# Patient Record
Sex: Female | Born: 1935 | Race: White | Hispanic: No
Health system: Southern US, Community
[De-identification: ages and names within clinical notes are randomized; demographics above are authoritative.]

---

## 1999-09-14 ENCOUNTER — Encounter: Payer: Self-pay | Admitting: Surgery

## 1999-09-15 ENCOUNTER — Inpatient Hospital Stay (HOSPITAL_COMMUNITY): Admission: RE | Admit: 1999-09-15 | Discharge: 1999-09-25 | Payer: Self-pay | Admitting: Surgery

## 1999-09-15 ENCOUNTER — Encounter: Payer: Self-pay | Admitting: Surgery

## 1999-09-16 ENCOUNTER — Encounter: Payer: Self-pay | Admitting: Critical Care Medicine

## 1999-09-16 ENCOUNTER — Encounter: Payer: Self-pay | Admitting: Surgery

## 1999-09-18 ENCOUNTER — Encounter: Payer: Self-pay | Admitting: *Deleted

## 1999-10-01 ENCOUNTER — Encounter: Payer: Self-pay | Admitting: Surgery

## 1999-10-01 ENCOUNTER — Inpatient Hospital Stay (HOSPITAL_COMMUNITY): Admission: AD | Admit: 1999-10-01 | Discharge: 1999-10-06 | Payer: Self-pay | Admitting: Surgery

## 2000-09-12 ENCOUNTER — Encounter: Admission: RE | Admit: 2000-09-12 | Discharge: 2000-09-12 | Payer: Self-pay | Admitting: Pulmonary Disease

## 2000-09-12 ENCOUNTER — Encounter: Payer: Self-pay | Admitting: Pulmonary Disease

## 2000-09-13 ENCOUNTER — Encounter (INDEPENDENT_AMBULATORY_CARE_PROVIDER_SITE_OTHER): Payer: Self-pay

## 2000-09-13 ENCOUNTER — Ambulatory Visit: Admission: RE | Admit: 2000-09-13 | Discharge: 2000-09-13 | Payer: Self-pay | Admitting: Pulmonary Disease

## 2000-10-17 ENCOUNTER — Encounter (INDEPENDENT_AMBULATORY_CARE_PROVIDER_SITE_OTHER): Payer: Self-pay

## 2000-10-17 ENCOUNTER — Other Ambulatory Visit: Admission: RE | Admit: 2000-10-17 | Discharge: 2000-10-17 | Payer: Self-pay | Admitting: Gastroenterology

## 2000-11-07 ENCOUNTER — Ambulatory Visit (HOSPITAL_COMMUNITY): Admission: RE | Admit: 2000-11-07 | Discharge: 2000-11-07 | Payer: Self-pay | Admitting: Gastroenterology

## 2000-12-15 ENCOUNTER — Ambulatory Visit (HOSPITAL_COMMUNITY): Admission: RE | Admit: 2000-12-15 | Discharge: 2000-12-15 | Payer: Self-pay | Admitting: Surgery

## 2000-12-15 ENCOUNTER — Encounter: Payer: Self-pay | Admitting: Surgery

## 2001-01-12 ENCOUNTER — Encounter: Payer: Self-pay | Admitting: Surgery

## 2001-01-18 ENCOUNTER — Inpatient Hospital Stay (HOSPITAL_COMMUNITY): Admission: RE | Admit: 2001-01-18 | Discharge: 2001-01-23 | Payer: Self-pay | Admitting: Surgery

## 2001-01-19 ENCOUNTER — Encounter: Payer: Self-pay | Admitting: Surgery

## 2001-04-14 ENCOUNTER — Ambulatory Visit (HOSPITAL_COMMUNITY): Admission: RE | Admit: 2001-04-14 | Discharge: 2001-04-14 | Payer: Self-pay | Admitting: Internal Medicine

## 2001-04-14 ENCOUNTER — Encounter: Payer: Self-pay | Admitting: Internal Medicine

## 2001-04-27 ENCOUNTER — Inpatient Hospital Stay (HOSPITAL_COMMUNITY): Admission: AD | Admit: 2001-04-27 | Discharge: 2001-05-01 | Payer: Self-pay | Admitting: Internal Medicine

## 2001-04-27 ENCOUNTER — Encounter: Payer: Self-pay | Admitting: Internal Medicine

## 2001-07-04 ENCOUNTER — Encounter: Payer: Self-pay | Admitting: Pulmonary Disease

## 2001-07-04 ENCOUNTER — Ambulatory Visit: Admission: RE | Admit: 2001-07-04 | Discharge: 2001-07-04 | Payer: Self-pay | Admitting: Pulmonary Disease

## 2001-12-08 ENCOUNTER — Ambulatory Visit (HOSPITAL_COMMUNITY): Admission: RE | Admit: 2001-12-08 | Discharge: 2001-12-08 | Payer: Self-pay | Admitting: Internal Medicine

## 2001-12-08 ENCOUNTER — Encounter: Payer: Self-pay | Admitting: Internal Medicine

## 2002-04-17 ENCOUNTER — Ambulatory Visit (HOSPITAL_COMMUNITY): Admission: RE | Admit: 2002-04-17 | Discharge: 2002-04-17 | Payer: Self-pay | Admitting: Internal Medicine

## 2002-04-17 ENCOUNTER — Encounter: Payer: Self-pay | Admitting: Internal Medicine

## 2002-08-03 ENCOUNTER — Encounter: Payer: Self-pay | Admitting: Emergency Medicine

## 2002-08-04 ENCOUNTER — Inpatient Hospital Stay (HOSPITAL_COMMUNITY): Admission: EM | Admit: 2002-08-04 | Discharge: 2002-08-07 | Payer: Self-pay | Admitting: Emergency Medicine

## 2002-08-04 ENCOUNTER — Encounter: Payer: Self-pay | Admitting: Emergency Medicine

## 2002-08-04 ENCOUNTER — Encounter: Payer: Self-pay | Admitting: General Surgery

## 2002-08-05 ENCOUNTER — Encounter: Payer: Self-pay | Admitting: General Surgery

## 2003-01-26 ENCOUNTER — Emergency Department (HOSPITAL_COMMUNITY): Admission: EM | Admit: 2003-01-26 | Discharge: 2003-01-26 | Payer: Self-pay | Admitting: *Deleted

## 2003-01-26 ENCOUNTER — Encounter: Payer: Self-pay | Admitting: *Deleted

## 2003-11-13 ENCOUNTER — Inpatient Hospital Stay (HOSPITAL_COMMUNITY): Admission: RE | Admit: 2003-11-13 | Discharge: 2003-11-19 | Payer: Self-pay | Admitting: Psychiatry

## 2003-11-13 ENCOUNTER — Emergency Department (HOSPITAL_COMMUNITY): Admission: EM | Admit: 2003-11-13 | Discharge: 2003-11-14 | Payer: Self-pay | Admitting: Emergency Medicine

## 2003-12-06 ENCOUNTER — Emergency Department (HOSPITAL_COMMUNITY): Admission: EM | Admit: 2003-12-06 | Discharge: 2003-12-06 | Payer: Self-pay | Admitting: Emergency Medicine

## 2003-12-11 ENCOUNTER — Inpatient Hospital Stay (HOSPITAL_COMMUNITY): Admission: RE | Admit: 2003-12-11 | Discharge: 2003-12-24 | Payer: Self-pay | Admitting: Psychiatry

## 2003-12-13 ENCOUNTER — Encounter: Payer: Self-pay | Admitting: Emergency Medicine

## 2003-12-22 ENCOUNTER — Encounter: Payer: Self-pay | Admitting: Psychiatry

## 2004-04-02 ENCOUNTER — Ambulatory Visit (HOSPITAL_COMMUNITY): Admission: RE | Admit: 2004-04-02 | Discharge: 2004-04-02 | Payer: Self-pay | Admitting: Neurology

## 2004-04-23 ENCOUNTER — Ambulatory Visit: Payer: Self-pay | Admitting: Psychiatry

## 2004-06-05 ENCOUNTER — Ambulatory Visit (HOSPITAL_COMMUNITY): Admission: RE | Admit: 2004-06-05 | Discharge: 2004-06-05 | Payer: Self-pay | Admitting: Internal Medicine

## 2004-07-02 ENCOUNTER — Ambulatory Visit: Payer: Self-pay | Admitting: Psychiatry

## 2004-08-05 ENCOUNTER — Emergency Department (HOSPITAL_COMMUNITY): Admission: EM | Admit: 2004-08-05 | Discharge: 2004-08-05 | Payer: Self-pay | Admitting: Emergency Medicine

## 2004-08-13 ENCOUNTER — Ambulatory Visit: Payer: Self-pay | Admitting: Psychiatry

## 2004-09-24 ENCOUNTER — Ambulatory Visit: Payer: Self-pay | Admitting: Psychiatry

## 2004-11-18 ENCOUNTER — Ambulatory Visit (HOSPITAL_COMMUNITY): Admission: RE | Admit: 2004-11-18 | Discharge: 2004-11-18 | Payer: Self-pay | Admitting: Internal Medicine

## 2004-11-18 ENCOUNTER — Ambulatory Visit: Payer: Self-pay | Admitting: Internal Medicine

## 2005-01-07 IMAGING — CR DG CHEST 2V
3 series · 3 of 3 positions shown · non-contrast
Comparison: none

CLINICAL DATA: Cough and hemoptysis.
 TWO VIEW CHEST
 Comparison, 12/13/2003.
 Mild atelectasis or scarring is again noted at the right middle lobe, which is unchanged.  There is no evidence of pulmonary consolidation or edema.  There is no evidence of pleural effusion.  Heart size is within normal limits.  Mild tortuosity of the thoracic aorta is noted.
 IMPRESSION
 Mild right middle lobe atelectasis vs. scarring, without change.  Otherwise, clear lungs.

[view not recorded (1 of 3)]
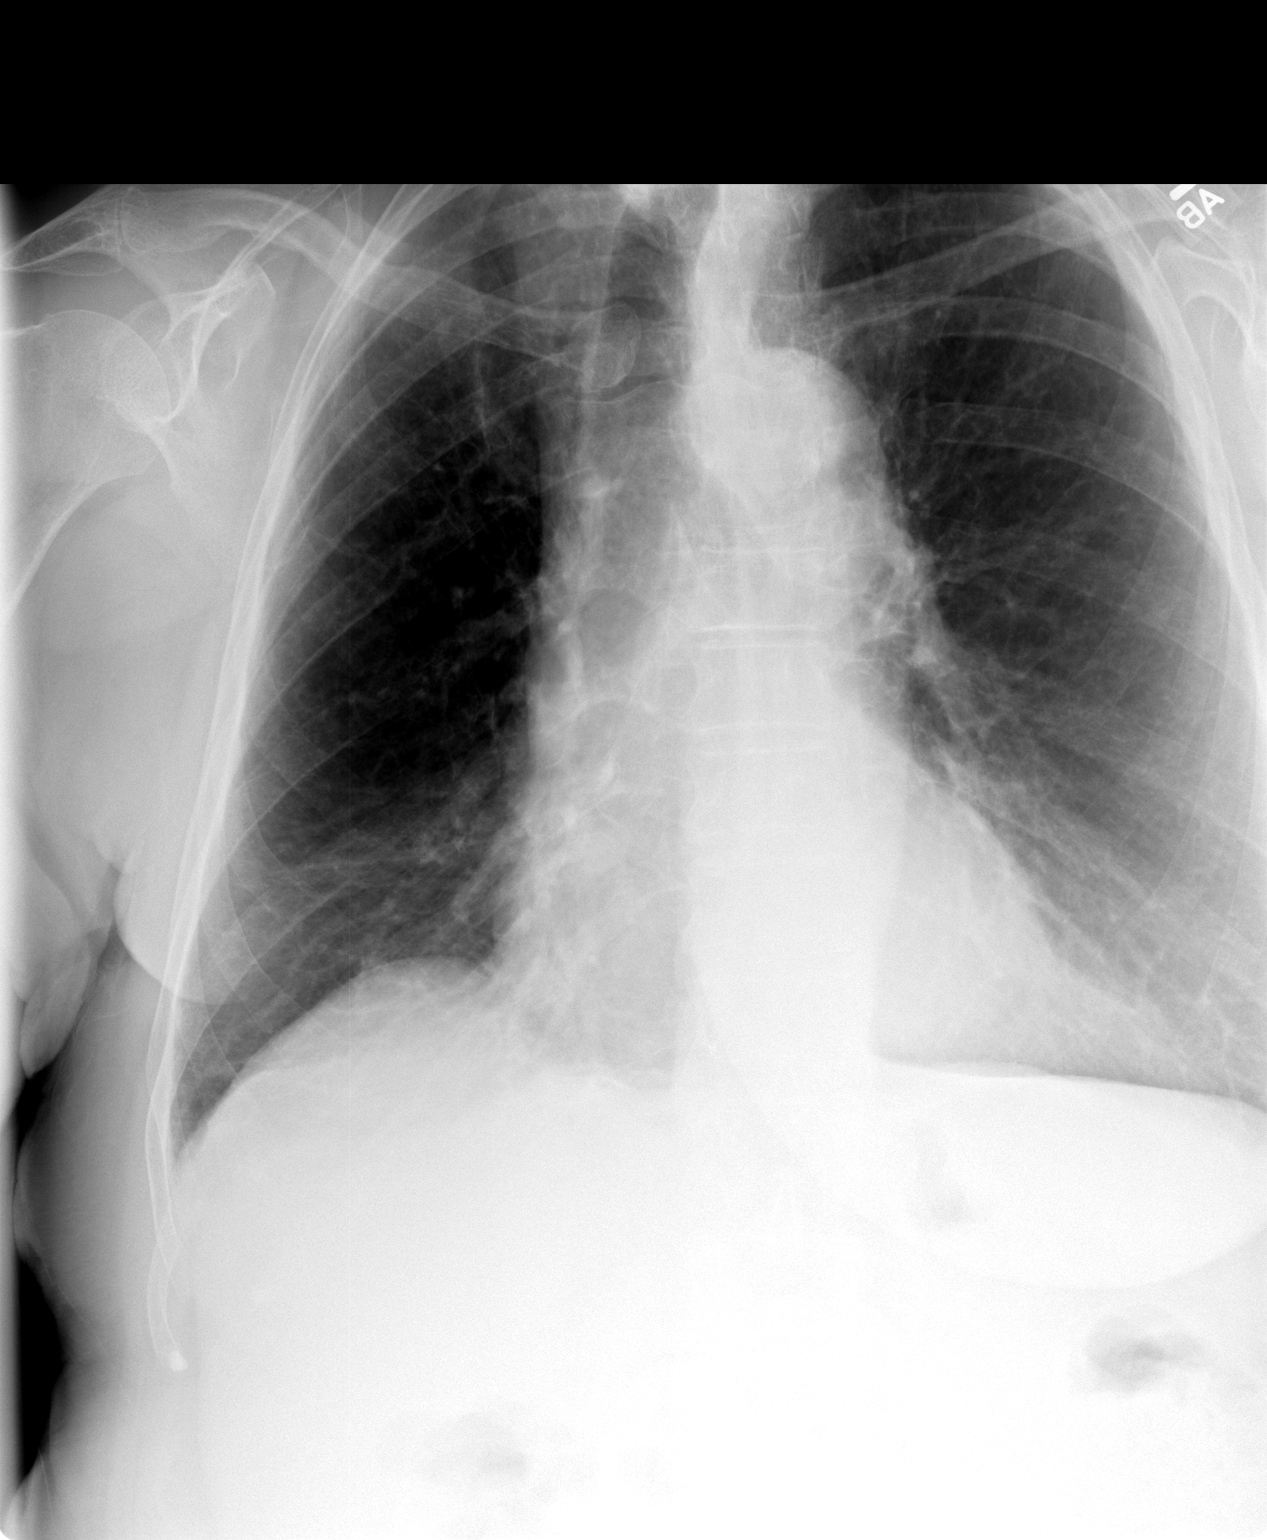

[view not recorded (2 of 3)]
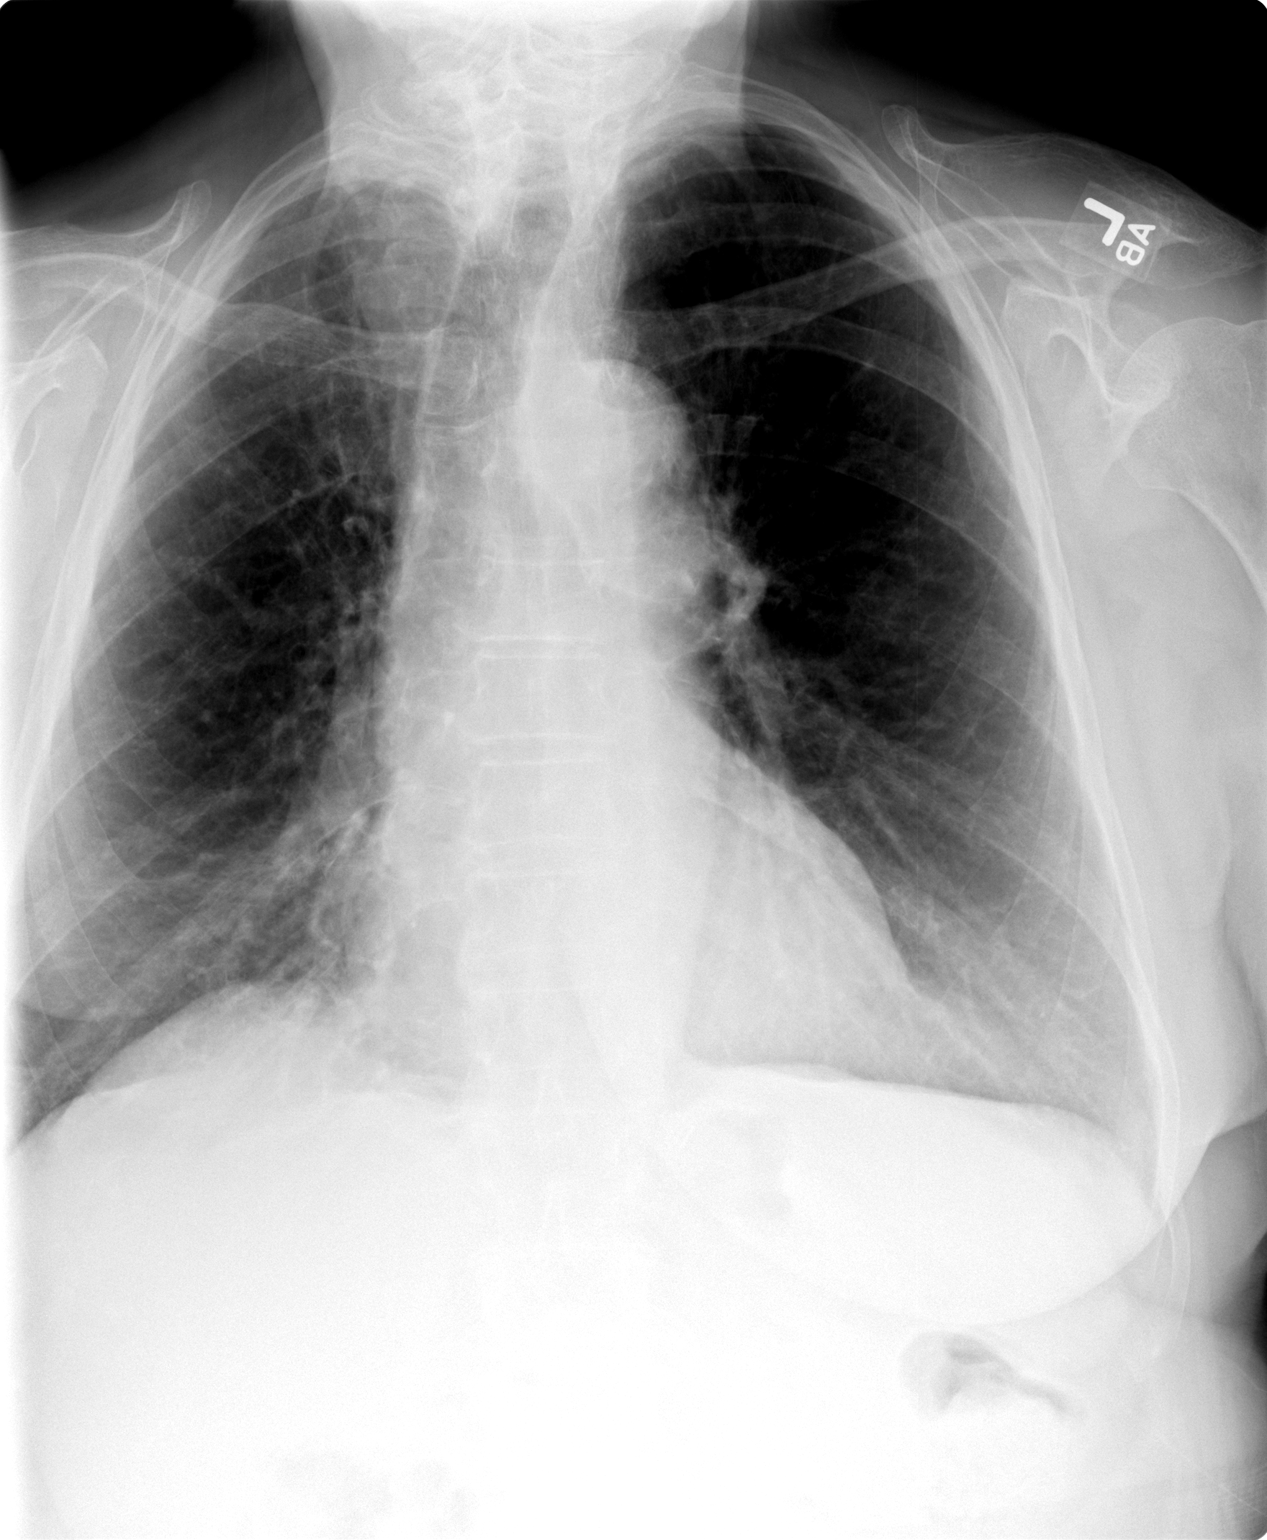

[view not recorded (3 of 3)]
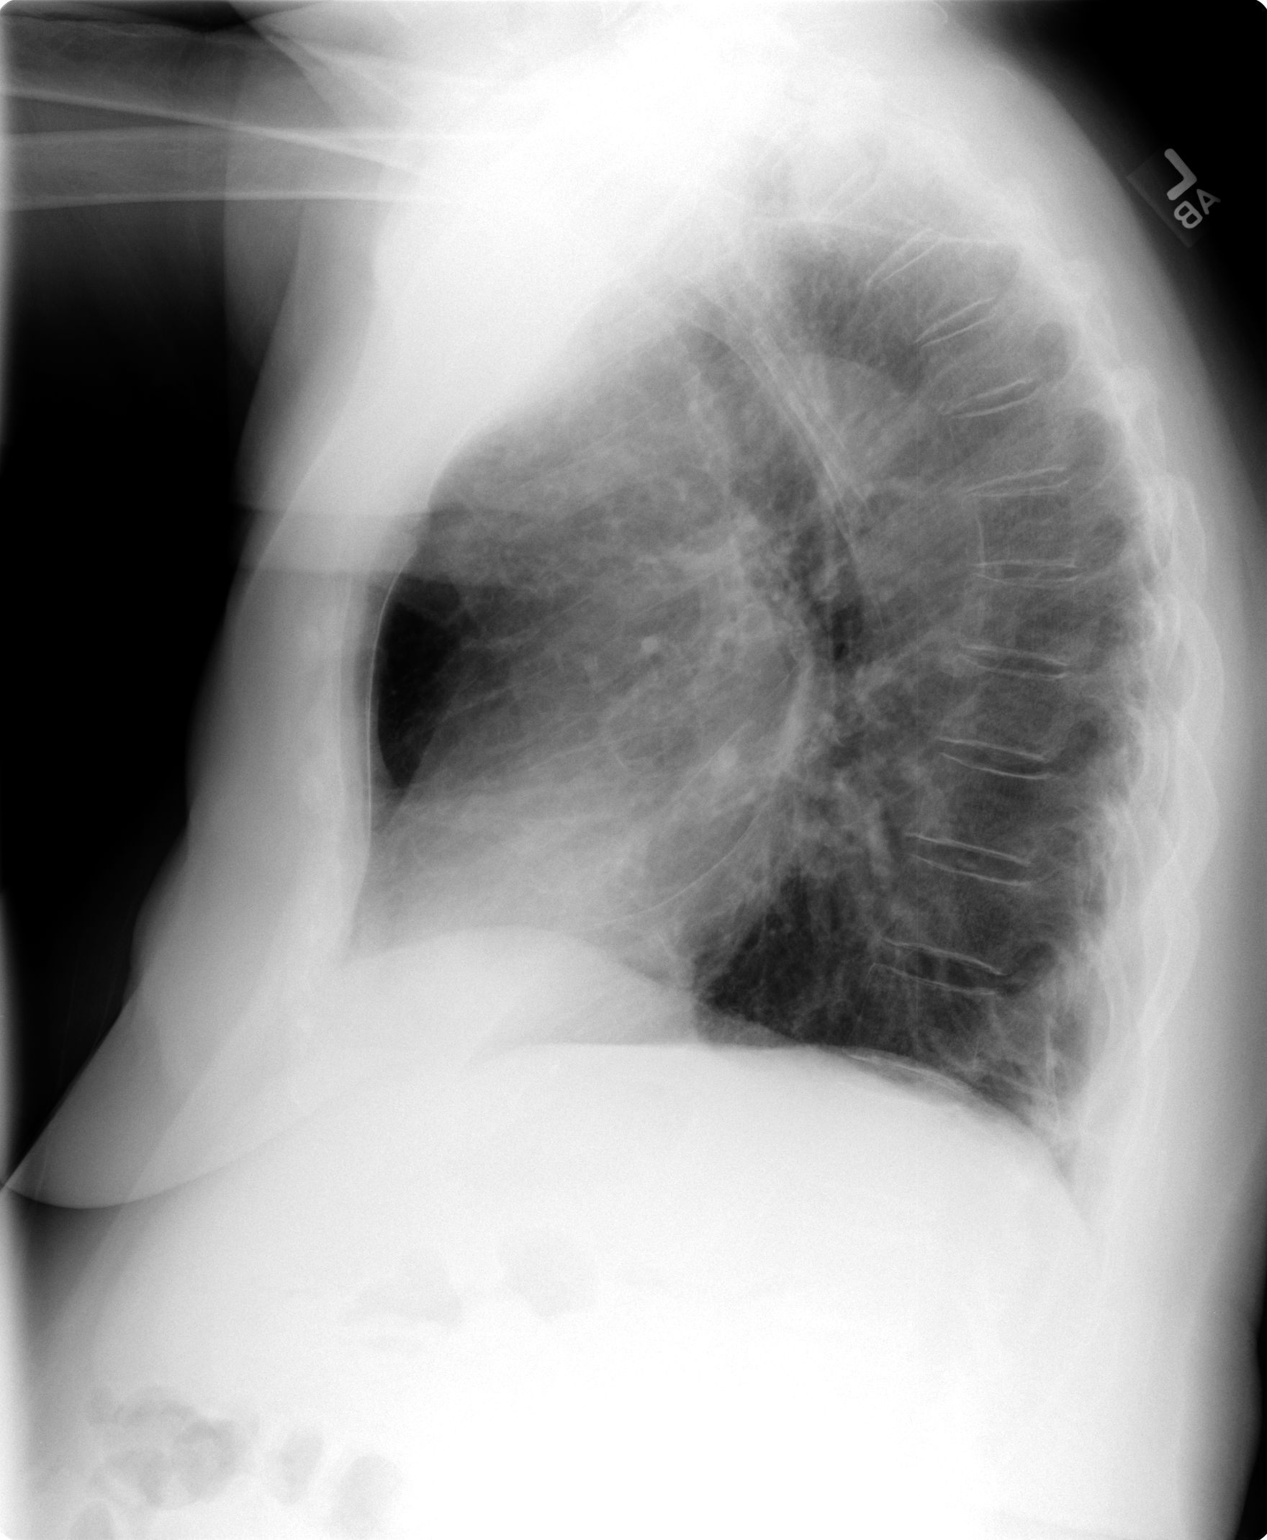

[3 of 3 positions shown; findings below may reference images not displayed]

## 2005-02-11 ENCOUNTER — Ambulatory Visit: Payer: Self-pay | Admitting: Psychiatry

## 2005-04-19 IMAGING — CT CT HEAD W/O CM
1 series · 16 of 28 positions shown, 20 images · non-contrast
Comparison: none

CLINICAL DATA: dizziness, forgetfulness.
 CT HEAD W/O CONTRAST
 Routine noncontrast CT head without priors for comparison.
 Mild generalized atrophy. Benign basal ganglia calcifications bilaterally.  Low attenuation at scattered site of deep cerebral white matter probably representing small vessel chronic ischemic changes.  No evidence of mass, hemorrhage or infarct.  Scattered atherosclerotic calcification, internal carotid and vertebral arteries. Sinuses clear.  Skull intact.
 IMPRESSION
 Atrophy with mild small vessel chronic ischemic changes of deep cerebral white matter. No acute intracranial abnormality.

[Series 2617: — · axial · 0.45mm/px · z∈[-704,-579]mm · 16 of 28 slices shown, 20 images]
[im 2/28  brain]
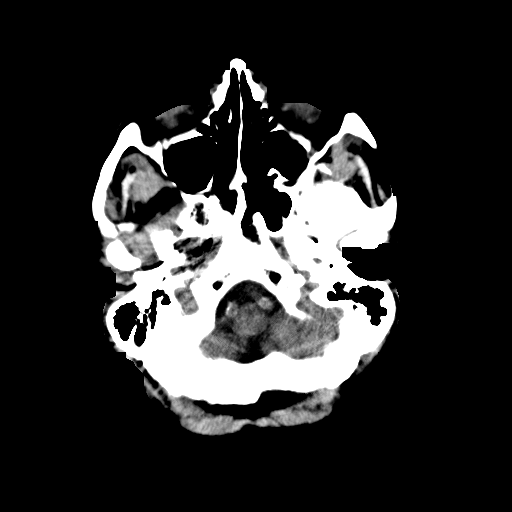
[im 2/28  bone]
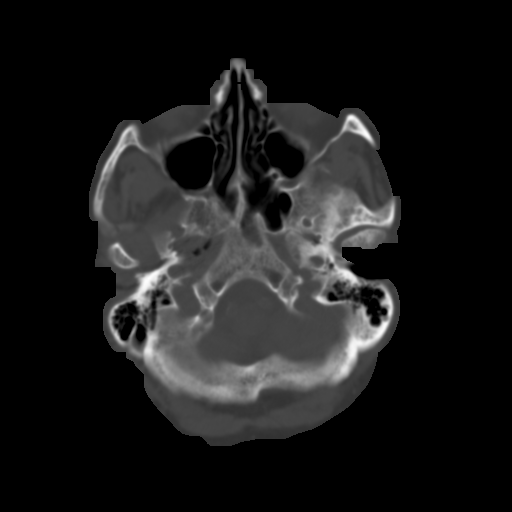
[im 4/28  brain]
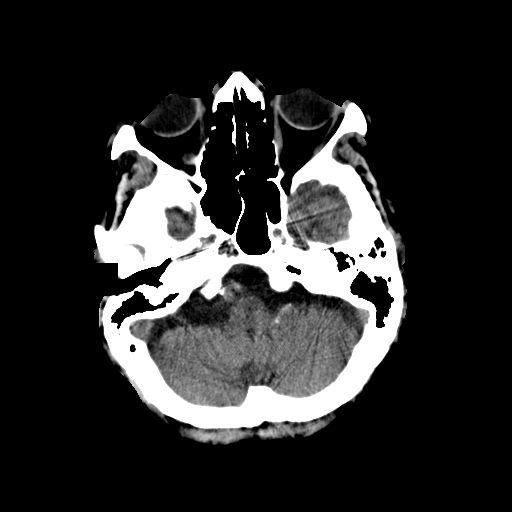
[im 6/28  brain]
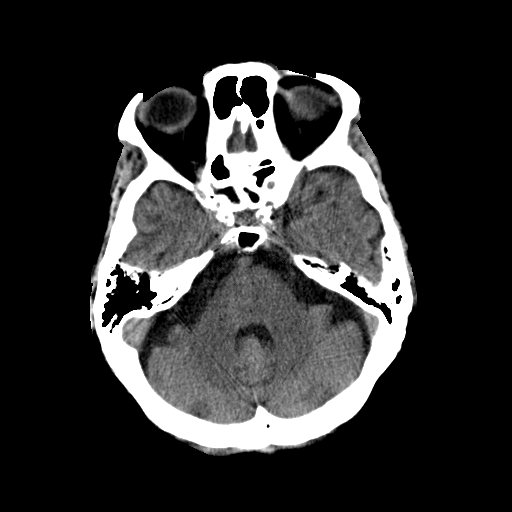
[im 7/28  brain]
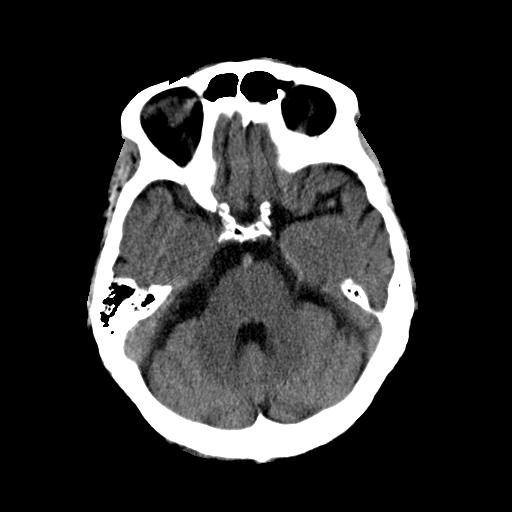
[im 9/28  brain]
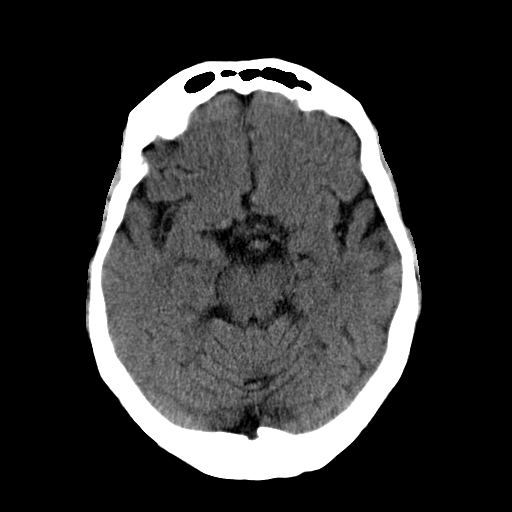
[im 9/28  bone]
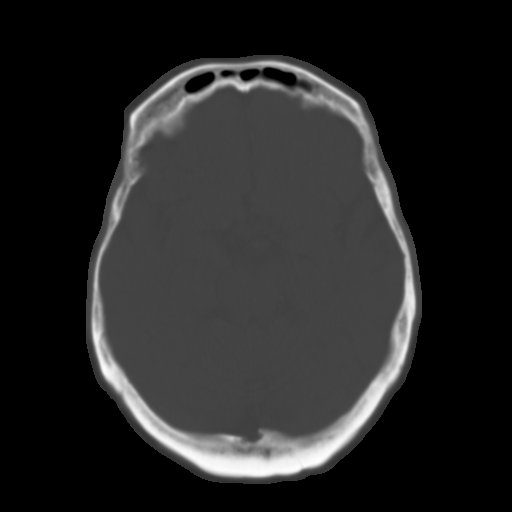
[im 10/28  brain]
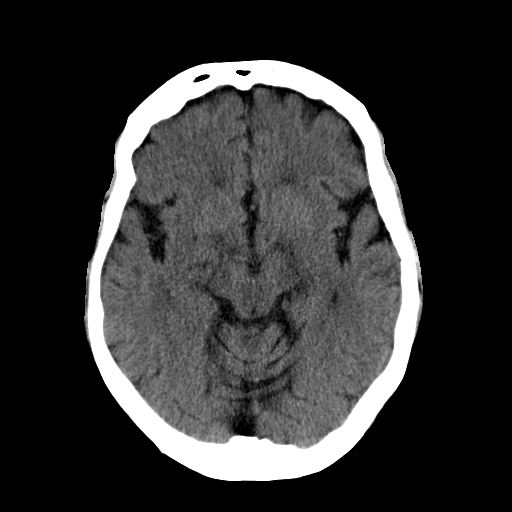
[im 12/28  brain]
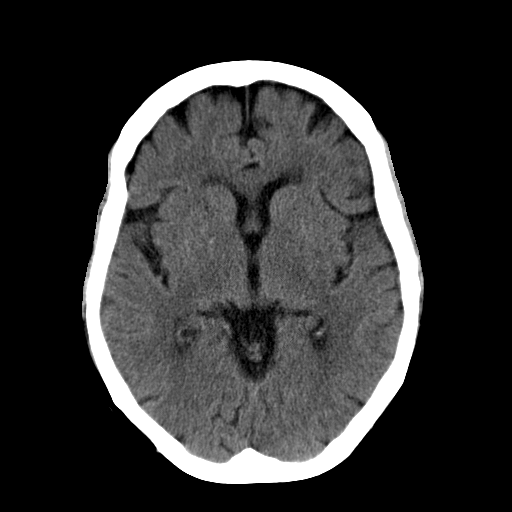
[im 14/28  brain]
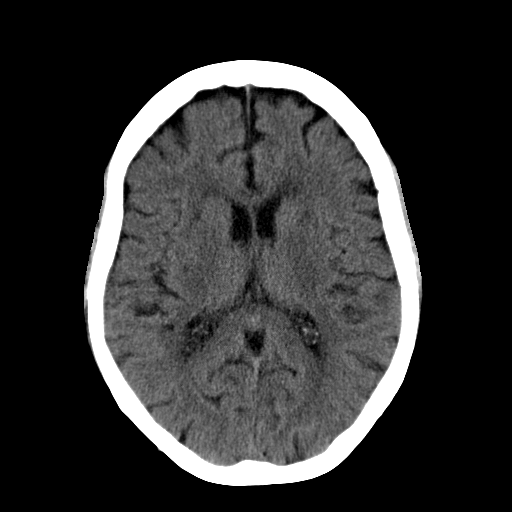
[im 15/28  brain]
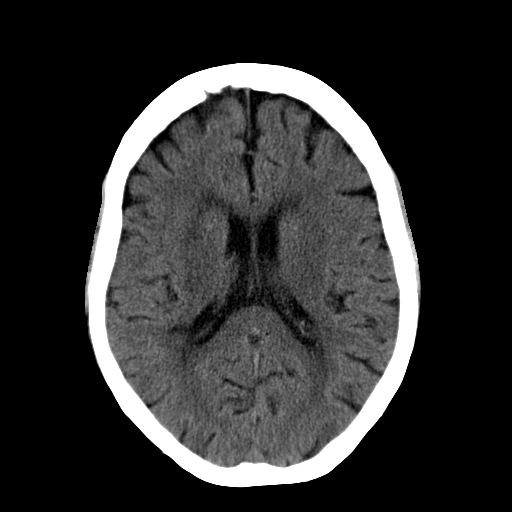
[im 15/28  bone]
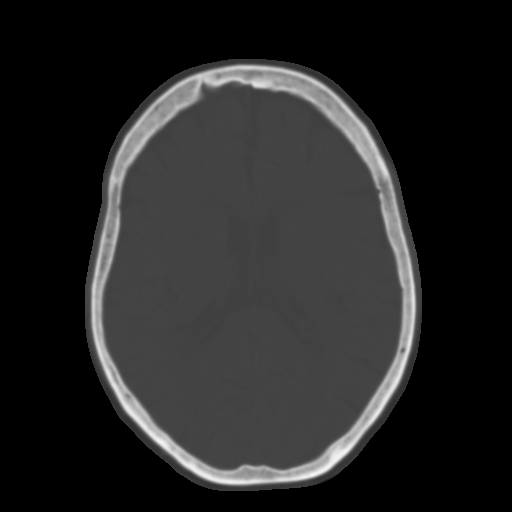
[im 17/28  brain]
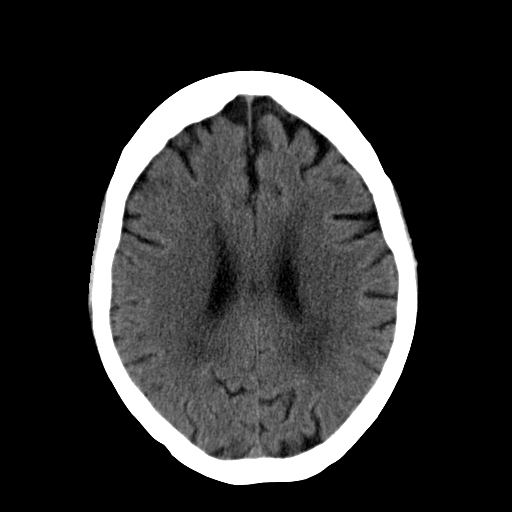
[im 19/28  brain]
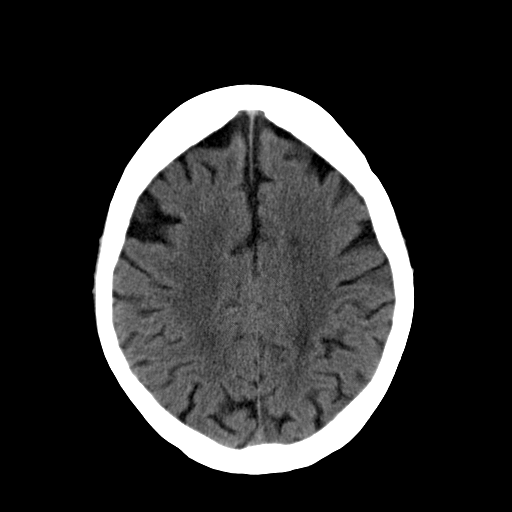
[im 20/28  brain]
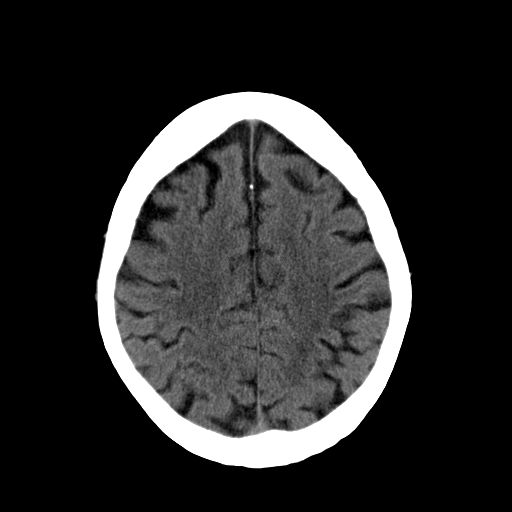
[im 22/28  brain]
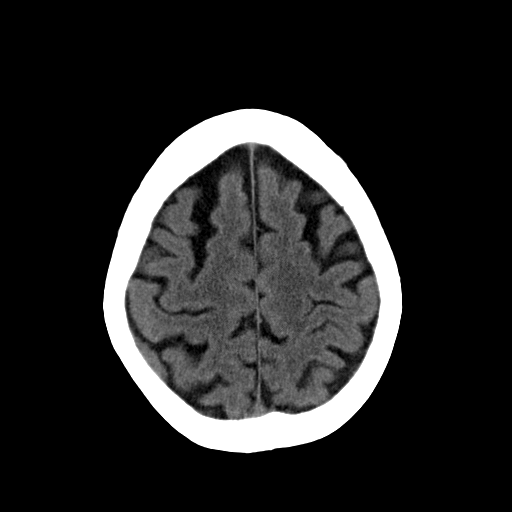
[im 22/28  bone]
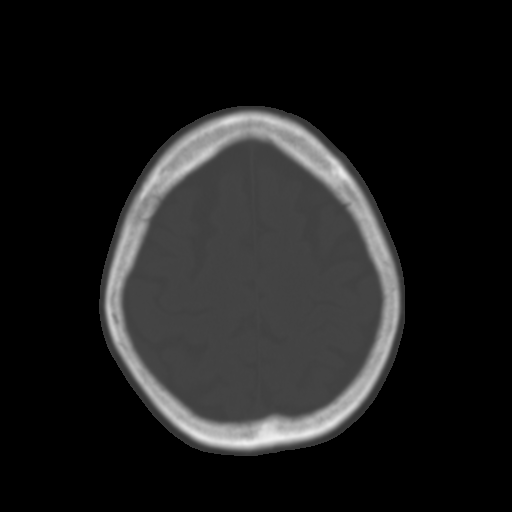
[im 23/28  brain]
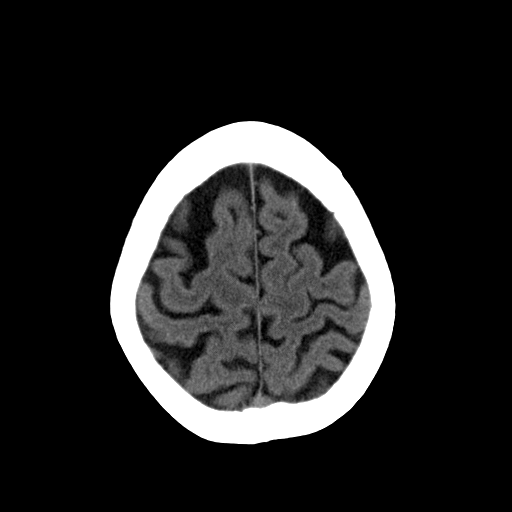
[im 25/28  brain]
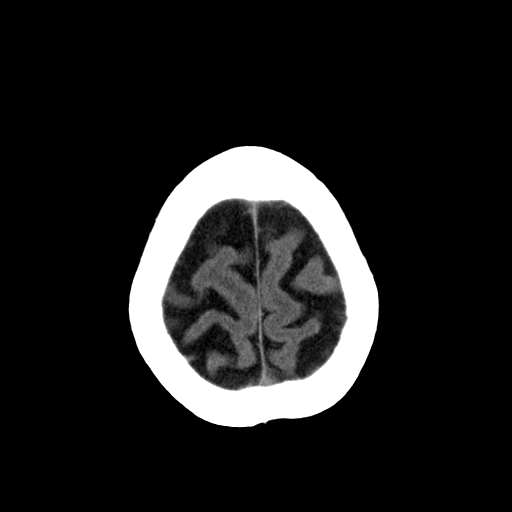
[im 27/28  brain]
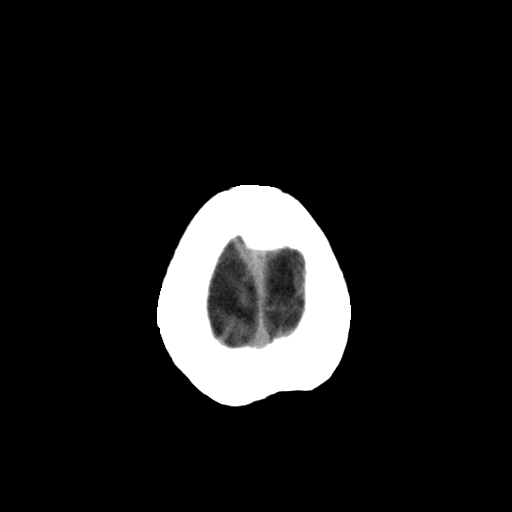

[16 of 28 positions shown; findings below may reference images not displayed]

## 2005-04-21 ENCOUNTER — Ambulatory Visit (HOSPITAL_COMMUNITY): Admission: RE | Admit: 2005-04-21 | Discharge: 2005-04-21 | Payer: Self-pay | Admitting: Internal Medicine

## 2005-05-06 ENCOUNTER — Ambulatory Visit: Payer: Self-pay | Admitting: Psychiatry

## 2005-07-01 ENCOUNTER — Ambulatory Visit: Payer: Self-pay | Admitting: Psychiatry

## 2005-07-12 ENCOUNTER — Ambulatory Visit (HOSPITAL_COMMUNITY): Admission: RE | Admit: 2005-07-12 | Discharge: 2005-07-12 | Payer: Self-pay | Admitting: Internal Medicine

## 2005-07-22 ENCOUNTER — Ambulatory Visit: Payer: Self-pay | Admitting: Psychology

## 2005-07-22 ENCOUNTER — Emergency Department (HOSPITAL_COMMUNITY): Admission: EM | Admit: 2005-07-22 | Discharge: 2005-07-22 | Payer: Self-pay | Admitting: Emergency Medicine

## 2005-08-26 ENCOUNTER — Ambulatory Visit: Payer: Self-pay | Admitting: Psychiatry

## 2005-09-10 ENCOUNTER — Emergency Department (HOSPITAL_COMMUNITY): Admission: EM | Admit: 2005-09-10 | Discharge: 2005-09-10 | Payer: Self-pay | Admitting: Emergency Medicine

## 2005-09-12 ENCOUNTER — Inpatient Hospital Stay (HOSPITAL_COMMUNITY): Admission: RE | Admit: 2005-09-12 | Discharge: 2005-09-20 | Payer: Self-pay | Admitting: Psychiatry

## 2005-09-12 ENCOUNTER — Emergency Department (HOSPITAL_COMMUNITY): Admission: EM | Admit: 2005-09-12 | Discharge: 2005-09-12 | Payer: Self-pay | Admitting: Emergency Medicine

## 2005-09-16 ENCOUNTER — Ambulatory Visit: Payer: Self-pay | Admitting: Psychiatry

## 2005-10-18 ENCOUNTER — Ambulatory Visit (HOSPITAL_COMMUNITY): Admission: RE | Admit: 2005-10-18 | Discharge: 2005-10-18 | Payer: Self-pay | Admitting: Internal Medicine

## 2005-10-19 ENCOUNTER — Ambulatory Visit (HOSPITAL_COMMUNITY): Payer: Self-pay | Admitting: Psychiatry

## 2005-11-02 ENCOUNTER — Ambulatory Visit (HOSPITAL_COMMUNITY): Payer: Self-pay | Admitting: Psychology

## 2005-11-16 ENCOUNTER — Ambulatory Visit (HOSPITAL_COMMUNITY): Payer: Self-pay | Admitting: Psychiatry

## 2005-12-01 ENCOUNTER — Ambulatory Visit (HOSPITAL_COMMUNITY): Payer: Self-pay | Admitting: Psychology

## 2006-01-13 ENCOUNTER — Ambulatory Visit (HOSPITAL_COMMUNITY): Payer: Self-pay | Admitting: Psychology

## 2006-01-18 ENCOUNTER — Ambulatory Visit (HOSPITAL_COMMUNITY): Payer: Self-pay | Admitting: Psychiatry

## 2006-02-08 ENCOUNTER — Ambulatory Visit (HOSPITAL_COMMUNITY): Payer: Self-pay | Admitting: Psychology

## 2006-02-28 ENCOUNTER — Ambulatory Visit (HOSPITAL_COMMUNITY): Payer: Self-pay | Admitting: Psychiatry

## 2006-03-14 ENCOUNTER — Ambulatory Visit (HOSPITAL_COMMUNITY): Payer: Self-pay | Admitting: Psychology

## 2006-03-22 ENCOUNTER — Ambulatory Visit (HOSPITAL_COMMUNITY): Payer: Self-pay | Admitting: Psychiatry

## 2006-04-19 ENCOUNTER — Ambulatory Visit (HOSPITAL_COMMUNITY): Payer: Self-pay | Admitting: Psychology

## 2006-05-17 ENCOUNTER — Ambulatory Visit (HOSPITAL_COMMUNITY): Payer: Self-pay | Admitting: Psychiatry

## 2006-05-18 ENCOUNTER — Ambulatory Visit (HOSPITAL_COMMUNITY): Payer: Self-pay | Admitting: Psychology

## 2006-06-29 ENCOUNTER — Inpatient Hospital Stay (HOSPITAL_COMMUNITY): Admission: EM | Admit: 2006-06-29 | Discharge: 2006-07-07 | Payer: Self-pay | Admitting: Emergency Medicine

## 2006-07-02 ENCOUNTER — Ambulatory Visit: Payer: Self-pay | Admitting: Orthopedic Surgery

## 2006-08-04 ENCOUNTER — Ambulatory Visit: Payer: Self-pay | Admitting: Orthopedic Surgery

## 2006-09-15 ENCOUNTER — Ambulatory Visit (HOSPITAL_COMMUNITY): Payer: Self-pay | Admitting: Psychiatry

## 2006-10-10 ENCOUNTER — Ambulatory Visit: Payer: Self-pay | Admitting: Orthopedic Surgery

## 2006-11-03 ENCOUNTER — Ambulatory Visit (HOSPITAL_COMMUNITY): Payer: Self-pay | Admitting: Psychiatry

## 2006-12-05 ENCOUNTER — Ambulatory Visit (HOSPITAL_COMMUNITY): Admission: RE | Admit: 2006-12-05 | Discharge: 2006-12-05 | Payer: Self-pay | Admitting: Internal Medicine

## 2007-01-03 ENCOUNTER — Ambulatory Visit (HOSPITAL_COMMUNITY): Payer: Self-pay | Admitting: Psychiatry

## 2007-04-04 ENCOUNTER — Ambulatory Visit (HOSPITAL_COMMUNITY): Payer: Self-pay | Admitting: Psychiatry

## 2007-07-04 ENCOUNTER — Ambulatory Visit (HOSPITAL_COMMUNITY): Payer: Self-pay | Admitting: Psychiatry

## 2007-07-19 ENCOUNTER — Emergency Department (HOSPITAL_COMMUNITY): Admission: EM | Admit: 2007-07-19 | Discharge: 2007-07-19 | Payer: Self-pay | Admitting: Emergency Medicine

## 2007-10-03 ENCOUNTER — Ambulatory Visit (HOSPITAL_COMMUNITY): Payer: Self-pay | Admitting: Psychiatry

## 2007-12-26 ENCOUNTER — Ambulatory Visit: Payer: Self-pay | Admitting: Internal Medicine

## 2007-12-28 ENCOUNTER — Ambulatory Visit (HOSPITAL_COMMUNITY): Payer: Self-pay | Admitting: Psychiatry

## 2008-01-15 ENCOUNTER — Ambulatory Visit (HOSPITAL_COMMUNITY): Admission: RE | Admit: 2008-01-15 | Discharge: 2008-01-15 | Payer: Self-pay | Admitting: Internal Medicine

## 2008-03-28 ENCOUNTER — Ambulatory Visit (HOSPITAL_COMMUNITY): Payer: Self-pay | Admitting: Psychiatry

## 2008-05-14 ENCOUNTER — Ambulatory Visit (HOSPITAL_COMMUNITY): Admission: RE | Admit: 2008-05-14 | Discharge: 2008-05-14 | Payer: Self-pay | Admitting: Internal Medicine

## 2008-06-27 ENCOUNTER — Ambulatory Visit (HOSPITAL_COMMUNITY): Payer: Self-pay | Admitting: Psychiatry

## 2008-07-04 ENCOUNTER — Observation Stay (HOSPITAL_COMMUNITY): Admission: EM | Admit: 2008-07-04 | Discharge: 2008-07-05 | Payer: Self-pay | Admitting: Emergency Medicine

## 2008-08-27 ENCOUNTER — Ambulatory Visit (HOSPITAL_COMMUNITY): Payer: Self-pay | Admitting: Psychiatry

## 2008-10-12 ENCOUNTER — Emergency Department (HOSPITAL_COMMUNITY): Admission: EM | Admit: 2008-10-12 | Discharge: 2008-10-12 | Payer: Self-pay | Admitting: Emergency Medicine

## 2008-10-17 ENCOUNTER — Emergency Department (HOSPITAL_COMMUNITY): Admission: EM | Admit: 2008-10-17 | Discharge: 2008-10-17 | Payer: Self-pay | Admitting: Emergency Medicine

## 2008-10-24 ENCOUNTER — Ambulatory Visit (HOSPITAL_COMMUNITY): Payer: Self-pay | Admitting: Psychiatry

## 2008-11-10 ENCOUNTER — Emergency Department (HOSPITAL_COMMUNITY): Admission: EM | Admit: 2008-11-10 | Discharge: 2008-11-10 | Payer: Self-pay | Admitting: Emergency Medicine

## 2008-11-21 ENCOUNTER — Ambulatory Visit (HOSPITAL_COMMUNITY): Payer: Self-pay | Admitting: Psychiatry

## 2008-12-13 ENCOUNTER — Emergency Department (HOSPITAL_COMMUNITY): Admission: EM | Admit: 2008-12-13 | Discharge: 2008-12-13 | Payer: Self-pay | Admitting: Emergency Medicine

## 2009-02-11 ENCOUNTER — Ambulatory Visit (HOSPITAL_COMMUNITY): Payer: Self-pay | Admitting: Psychiatry

## 2009-03-24 ENCOUNTER — Ambulatory Visit (HOSPITAL_COMMUNITY): Admission: RE | Admit: 2009-03-24 | Discharge: 2009-03-24 | Payer: Self-pay | Admitting: Internal Medicine

## 2009-05-08 ENCOUNTER — Ambulatory Visit (HOSPITAL_COMMUNITY): Payer: Self-pay | Admitting: Psychiatry

## 2009-05-19 ENCOUNTER — Ambulatory Visit (HOSPITAL_COMMUNITY): Admission: RE | Admit: 2009-05-19 | Discharge: 2009-05-19 | Payer: Self-pay | Admitting: Internal Medicine

## 2009-07-21 ENCOUNTER — Inpatient Hospital Stay (HOSPITAL_COMMUNITY): Admission: AD | Admit: 2009-07-21 | Discharge: 2009-07-23 | Payer: Self-pay | Admitting: Internal Medicine

## 2009-08-05 ENCOUNTER — Ambulatory Visit (HOSPITAL_COMMUNITY): Payer: Self-pay | Admitting: Psychiatry

## 2009-09-16 ENCOUNTER — Ambulatory Visit (HOSPITAL_COMMUNITY): Payer: Self-pay | Admitting: Psychiatry

## 2009-11-04 ENCOUNTER — Ambulatory Visit (HOSPITAL_COMMUNITY): Payer: Self-pay | Admitting: Psychiatry

## 2009-12-18 ENCOUNTER — Ambulatory Visit (HOSPITAL_COMMUNITY): Admission: RE | Admit: 2009-12-18 | Discharge: 2009-12-18 | Payer: Self-pay | Admitting: Internal Medicine

## 2010-02-10 ENCOUNTER — Ambulatory Visit (HOSPITAL_COMMUNITY): Payer: Self-pay | Admitting: Psychiatry

## 2010-05-07 ENCOUNTER — Ambulatory Visit (HOSPITAL_COMMUNITY): Payer: Self-pay | Admitting: Psychiatry

## 2010-06-02 ENCOUNTER — Ambulatory Visit (HOSPITAL_COMMUNITY): Payer: Self-pay | Admitting: Psychiatry

## 2010-07-30 ENCOUNTER — Ambulatory Visit (HOSPITAL_COMMUNITY): Payer: Self-pay | Admitting: Psychiatry

## 2010-09-06 ENCOUNTER — Encounter: Payer: Self-pay | Admitting: Internal Medicine

## 2010-09-29 ENCOUNTER — Encounter (HOSPITAL_COMMUNITY): Payer: No Typology Code available for payment source | Admitting: Psychiatry

## 2010-10-04 ENCOUNTER — Emergency Department (HOSPITAL_COMMUNITY): Payer: No Typology Code available for payment source

## 2010-10-04 ENCOUNTER — Emergency Department (HOSPITAL_COMMUNITY)
Admission: EM | Admit: 2010-10-04 | Discharge: 2010-10-04 | Disposition: A | Discharge status: Home / Self Care | Payer: No Typology Code available for payment source | Attending: Emergency Medicine | Admitting: Emergency Medicine

## 2010-10-04 DIAGNOSIS — R0602 Shortness of breath: Secondary | ICD-10-CM | POA: Insufficient documentation

## 2010-10-04 DIAGNOSIS — J4489 Other specified chronic obstructive pulmonary disease: Secondary | ICD-10-CM | POA: Insufficient documentation

## 2010-10-04 DIAGNOSIS — R05 Cough: Secondary | ICD-10-CM | POA: Insufficient documentation

## 2010-10-04 DIAGNOSIS — R059 Cough, unspecified: Secondary | ICD-10-CM | POA: Insufficient documentation

## 2010-10-04 DIAGNOSIS — M810 Age-related osteoporosis without current pathological fracture: Secondary | ICD-10-CM | POA: Insufficient documentation

## 2010-10-04 DIAGNOSIS — E119 Type 2 diabetes mellitus without complications: Secondary | ICD-10-CM | POA: Insufficient documentation

## 2010-10-04 DIAGNOSIS — J449 Chronic obstructive pulmonary disease, unspecified: Secondary | ICD-10-CM | POA: Insufficient documentation

## 2010-10-04 DIAGNOSIS — E538 Deficiency of other specified B group vitamins: Secondary | ICD-10-CM | POA: Insufficient documentation

## 2010-10-04 DIAGNOSIS — Z853 Personal history of malignant neoplasm of breast: Secondary | ICD-10-CM | POA: Insufficient documentation

## 2010-10-04 DIAGNOSIS — I10 Essential (primary) hypertension: Secondary | ICD-10-CM | POA: Insufficient documentation

## 2010-10-04 LAB — CBC
HCT: 33.7 % — ABNORMAL LOW (ref 36.0–46.0)
Hemoglobin: 11.1 g/dL — ABNORMAL LOW (ref 12.0–15.0)
MCH: 29.8 pg (ref 26.0–34.0)
MCHC: 32.9 g/dL (ref 30.0–36.0)
MCV: 90.6 fL (ref 78.0–100.0)

## 2010-10-04 LAB — DIFFERENTIAL
Basophils Absolute: 0.1 10*3/uL (ref 0.0–0.1)
Lymphocytes Relative: 31 % (ref 12–46)
Lymphs Abs: 1.9 10*3/uL (ref 0.7–4.0)
Monocytes Absolute: 0.5 10*3/uL (ref 0.1–1.0)
Monocytes Relative: 8 % (ref 3–12)
Neutro Abs: 3.1 10*3/uL (ref 1.7–7.7)

## 2010-10-04 LAB — COMPREHENSIVE METABOLIC PANEL
ALT: 13 U/L (ref 0–35)
BUN: 17 mg/dL (ref 6–23)
CO2: 24 mEq/L (ref 19–32)
Calcium: 8.7 mg/dL (ref 8.4–10.5)
GFR calc non Af Amer: 49 mL/min — ABNORMAL LOW (ref >60)
Glucose, Bld: 93 mg/dL (ref 70–99)
Sodium: 143 mEq/L (ref 135–145)

## 2010-10-15 ENCOUNTER — Encounter (INDEPENDENT_AMBULATORY_CARE_PROVIDER_SITE_OTHER): Payer: No Typology Code available for payment source | Admitting: Psychiatry

## 2010-10-15 DIAGNOSIS — F331 Major depressive disorder, recurrent, moderate: Secondary | ICD-10-CM

## 2010-10-30 ENCOUNTER — Inpatient Hospital Stay (HOSPITAL_COMMUNITY): Payer: No Typology Code available for payment source

## 2010-10-30 ENCOUNTER — Inpatient Hospital Stay (HOSPITAL_COMMUNITY)
Admission: RE | Admit: 2010-10-30 | Discharge: 2010-11-04 | DRG: 194 | Disposition: A | Discharge status: Skilled Nursing Facility | Payer: No Typology Code available for payment source | Source: Ambulatory Visit | Attending: Internal Medicine | Admitting: Internal Medicine

## 2010-10-30 DIAGNOSIS — E860 Dehydration: Secondary | ICD-10-CM | POA: Diagnosis present

## 2010-10-30 DIAGNOSIS — E119 Type 2 diabetes mellitus without complications: Secondary | ICD-10-CM | POA: Diagnosis present

## 2010-10-30 DIAGNOSIS — E876 Hypokalemia: Secondary | ICD-10-CM | POA: Diagnosis not present

## 2010-10-30 DIAGNOSIS — N189 Chronic kidney disease, unspecified: Secondary | ICD-10-CM | POA: Diagnosis present

## 2010-10-30 DIAGNOSIS — N390 Urinary tract infection, site not specified: Secondary | ICD-10-CM | POA: Diagnosis present

## 2010-10-30 DIAGNOSIS — Z853 Personal history of malignant neoplasm of breast: Secondary | ICD-10-CM

## 2010-10-30 DIAGNOSIS — M810 Age-related osteoporosis without current pathological fracture: Secondary | ICD-10-CM | POA: Diagnosis present

## 2010-10-30 DIAGNOSIS — J189 Pneumonia, unspecified organism: Principal | ICD-10-CM | POA: Diagnosis present

## 2010-10-30 DIAGNOSIS — Z66 Do not resuscitate: Secondary | ICD-10-CM | POA: Diagnosis present

## 2010-10-30 DIAGNOSIS — F329 Major depressive disorder, single episode, unspecified: Secondary | ICD-10-CM | POA: Diagnosis present

## 2010-10-30 DIAGNOSIS — A498 Other bacterial infections of unspecified site: Secondary | ICD-10-CM | POA: Diagnosis present

## 2010-10-30 DIAGNOSIS — E538 Deficiency of other specified B group vitamins: Secondary | ICD-10-CM | POA: Diagnosis present

## 2010-10-30 LAB — COMPREHENSIVE METABOLIC PANEL
AST: 20 U/L (ref 0–37)
Albumin: 3.1 g/dL — ABNORMAL LOW (ref 3.5–5.2)
Calcium: 8.6 mg/dL (ref 8.4–10.5)
Creatinine, Ser: 1.31 mg/dL — ABNORMAL HIGH (ref 0.4–1.2)
GFR calc Af Amer: 46 mL/min — ABNORMAL LOW (ref >60)
GFR calc non Af Amer: 38 mL/min — ABNORMAL LOW (ref >60)

## 2010-10-30 LAB — URINE MICROSCOPIC-ADD ON

## 2010-10-30 LAB — CBC
MCH: 29.5 pg (ref 26.0–34.0)
MCHC: 33.7 g/dL (ref 30.0–36.0)
Platelets: 202 10*3/uL (ref 150–400)
RDW: 14.2 % (ref 11.5–15.5)

## 2010-10-30 LAB — URINALYSIS, ROUTINE W REFLEX MICROSCOPIC
Nitrite: POSITIVE — AB
Specific Gravity, Urine: >1.03 — ABNORMAL HIGH (ref 1.005–1.030)
pH: 5.5 (ref 5.0–8.0)

## 2010-10-30 LAB — BLOOD GAS, ARTERIAL
Acid-base deficit: 2.3 mmol/L — ABNORMAL HIGH (ref 0.0–2.0)
Drawn by: 23534
FIO2: 0.21 %
TCO2: 19.7 mmol/L (ref 0–100)
pH, Arterial: 7.413 — ABNORMAL HIGH (ref 7.350–7.400)

## 2010-10-30 LAB — DIFFERENTIAL
Basophils Relative: 0 % (ref 0–1)
Eosinophils Absolute: 0 10*3/uL (ref 0.0–0.7)
Monocytes Absolute: 1 10*3/uL (ref 0.1–1.0)
Monocytes Relative: 11 % (ref 3–12)
Neutrophils Relative %: 81 % — ABNORMAL HIGH (ref 43–77)

## 2010-11-01 LAB — DIFFERENTIAL
Basophils Absolute: 0 10*3/uL (ref 0.0–0.1)
Basophils Relative: 0 % (ref 0–1)
Eosinophils Absolute: 0.1 10*3/uL (ref 0.0–0.7)
Lymphs Abs: 1 10*3/uL (ref 0.7–4.0)
Neutrophils Relative %: 81 % — ABNORMAL HIGH (ref 43–77)

## 2010-11-01 LAB — BASIC METABOLIC PANEL
CO2: 25 mEq/L (ref 19–32)
Glucose, Bld: 133 mg/dL — ABNORMAL HIGH (ref 70–99)
Potassium: 3.2 mEq/L — ABNORMAL LOW (ref 3.5–5.1)
Sodium: 140 mEq/L (ref 135–145)

## 2010-11-01 LAB — CBC
MCV: 89.2 fL (ref 78.0–100.0)
Platelets: 222 10*3/uL (ref 150–400)
RBC: 3.44 MIL/uL — ABNORMAL LOW (ref 3.87–5.11)
WBC: 10.9 10*3/uL — ABNORMAL HIGH (ref 4.0–10.5)

## 2010-11-02 LAB — URINE CULTURE
Colony Count: 40000
Culture  Setup Time: 201203170247

## 2010-11-03 LAB — DIFFERENTIAL
Basophils Absolute: 0 10*3/uL (ref 0.0–0.1)
Basophils Relative: 0 % (ref 0–1)
Eosinophils Absolute: 0.3 10*3/uL (ref 0.0–0.7)
Lymphocytes Relative: 11 % — ABNORMAL LOW (ref 12–46)
Lymphs Abs: 1 10*3/uL (ref 0.7–4.0)
Monocytes Absolute: 0.8 10*3/uL (ref 0.1–1.0)
Monocytes Relative: 9 % (ref 3–12)
Neutro Abs: 6.8 10*3/uL (ref 1.7–7.7)

## 2010-11-03 LAB — BASIC METABOLIC PANEL
CO2: 28 mEq/L (ref 19–32)
GFR calc Af Amer: >60 mL/min (ref >60)
GFR calc non Af Amer: >60 mL/min (ref >60)
Glucose, Bld: 114 mg/dL — ABNORMAL HIGH (ref 70–99)
Potassium: 4.9 mEq/L (ref 3.5–5.1)
Sodium: 142 mEq/L (ref 135–145)

## 2010-11-03 LAB — CBC
MCH: 29.5 pg (ref 26.0–34.0)
MCHC: 32.9 g/dL (ref 30.0–36.0)
Platelets: 248 10*3/uL (ref 150–400)
RBC: 3.42 MIL/uL — ABNORMAL LOW (ref 3.87–5.11)

## 2010-11-04 LAB — BLOOD GAS, ARTERIAL
Acid-Base Excess: 4 mmol/L — ABNORMAL HIGH (ref 0.0–2.0)
O2 Content: 21 L/min
O2 Saturation: 89.1 %

## 2010-11-04 LAB — CULTURE, BLOOD (ROUTINE X 2): Culture: NO GROWTH

## 2010-11-04 LAB — CULTURE, RESPIRATORY W GRAM STAIN: Culture: NORMAL

## 2010-11-08 NOTE — Progress Notes (Signed)
  Destiny [name], Destiny [name]                  ACCOUNT NO.:  [id number]  MEDICAL RECORD NO.:  [id number]           PATIENT TYPE:  LOCATION:                                 FACILITY:  PHYSICIAN:  Kingsley Callander. Ouida Sills, MD       DATE OF BIRTH:  01/25/1918  DATE OF PROCEDURE: DATE OF DISCHARGE:                                PROGRESS NOTE   HISTORY OF PRESENT ILLNESS:  The patient feels a little better today. She continues to cough.  She had a maximum temperature yesterday of 102.1.  Her temperature was 99.9 this morning with a pulse of 87, respirations 20, and blood pressure of 139/64.  Her oxygen saturations have ranged from 91-95% on 2-3 liters by nasal cannula.  She appears to be breathing comfortably and his in no distress.  PHYSICAL EXAMINATION:  LUNGS:  Bilateral rhonchi. HEART:  Regular with a grade 2 systolic murmur. ABDOMEN:  Soft and nontender. EXTREMITIES:  No edema or cyanosis.  IMPRESSION/PLAN: 1. Right lower lobe pneumonia:  Repeat white count is 10.9 with 81     segs and 9 lymphs.  Blood cultures are negative.  Continue     ceftriaxone and azithromycin. 2. Dehydration, resolved:  BUN and creatinine are approved at 11 and     0.84. 3. Hypokalemia:  Potassium is 3.2.  Potassium will be supplemented     orally. 4. Diabetes:  Glucose control is stable with a glucose of 133. 5. Urinary tract infection:  Urine culture is pending. 6. Deconditioning:  She has difficulty walking.  She will likely     require skilled nursing facility placement at discharge.     Kingsley Callander. Ouida Sills, MD     ROF/MEDQ  D:  11/01/2010  T:  11/01/2010  Job:  [id number]  Electronically Signed by Carylon Perches MD on 11/08/2010 08:37:08 AM

## 2010-11-08 NOTE — Progress Notes (Signed)
  NAMEAMELYA, Destiny [name]                  ACCOUNT NO.:  [id number]  MEDICAL RECORD NO.:  [id number]           PATIENT TYPE:  I  LOCATION:  A313                          FACILITY:  [facility]  PHYSICIAN:  Kingsley Callander. Ouida Sills, MD       DATE OF BIRTH:  1917/10/24  DATE OF PROCEDURE: DATE OF DISCHARGE:                                PROGRESS NOTE   HISTORY:  Destiny [name] is feeling better and getting stronger.  She has not had recurrent fever.  PHYSICAL EXAMINATION:  VITAL SIGNS:  Stable.  Her oxygen saturations have been in the low to mid 90s on 2 liters.  She appears to be breathing comfortably.  She does cough frequently.  Sputum production is decreasing. LUNGS:  Clear. HEART:  Regular with a grade 2 systolic murmur. EXTREMITIES:  No edema or cyanosis.  IMPRESSION/PLAN: 1. Right lower lobe pneumonia.  Her sputum culture reveals normal     oropharyngeal flora.  Blood cultures are negative.  Her repeat     white count is 8.9 with 76 segs and 11 lymphs.  Continue Rocephin.     Zithromax will be finished today.  She has a mild anemia with a     hemoglobin of 10.1. 2. Hypokalemia, resolved with a potassium of 4.9 after     supplementation.  Renal function has returned to normal with a BUN     and creatinine of 11 and 0.86. 3. Urinary tract infection with a culture revealing E-coli, which is     sensitive to cephalosporins.     Kingsley Callander. Ouida Sills, MD     ROF/MEDQ  D:  11/03/2010  T:  11/03/2010  Job:  [id number]  Electronically Signed by Carylon Perches MD on 11/08/2010 08:37:12 AM

## 2010-11-08 NOTE — Discharge Summary (Addendum)
  Destiny [name], Destiny [name]                  ACCOUNT NO.:  [id number]  MEDICAL RECORD NO.:  [id number]           PATIENT TYPE:  LOCATION:                                 FACILITY:  PHYSICIAN:  Kingsley Callander. Ouida Sills, MD       DATE OF BIRTH:  September 17, 1917  DATE OF ADMISSION: DATE OF DISCHARGE:  LH                              DISCHARGE SUMMARY   DISCHARGE DIAGNOSES: 1. Community-acquired pneumonia. 2. Type 2 diabetes. 3. Depression with psychosis. 4. Vitamin B12 deficiency. 5. Spinal stenosis. 6. Osteoporosis. 7. Hypokalemia. 8. Dehydration. 9. Escherichia coli urinary tract infection.  HOSPITAL COURSE:  This patient is a 77 year old female, who presented with generalized weakness and cough.  She was not able to eat or drink well and not been able to walk independently.  Her white count was 9.8. Her pO2 on room air was 52 on her ABG.  Her chest x-ray revealed a right lower lobe infiltrate consistent with pneumonia.  She developed a maximum temperature of 102.  She was treated with IV Rocephin and Zithromax.  Her fever resolved.  Her blood cultures were negative.  Her sputum culture revealed normal flora.  Her repeat CBC revealed white count of 8.9.  She has been continued on oxygen.  She has been treated with nebulizer treatments.  Her ABG on room air at discharge revealed pH of 7.43, pCO2 of 42, and pO2 of 57.  She will be continued on supplemental oxygen.  Arrangements have been made for transfer to [facility].  Her urine culture revealed E. coli with greater than 40,000 colonies per mL.  She was initially dehydrated with a BUN and creatinine of 19 and1.31.  Her BUN and creatinine improved to 11 and 0.86.  She has a history of type 2 diabetes, although her glucoses have remained quite normal as she has lost weight with age.  Her most recent glucose is 114.  Her overall condition has significantly improved.  She is stable for discharge to [facility].  DISCHARGE MEDICATIONS: 1. Avelox 400 mg  daily for 7 days. 2. Ventolin and Atrovent nebulizer treatments t.i.d. for 10 days. 3. Oxygen 2 liters by nasal cannula. 4. Zoloft 150 mg daily. 5. Lorazepam 0.5 mg t.i.d. p.r.n. 6. Zyprexa 2.5 mg at bedtime. 7. Wellbutrin XL 300 mg daily.  DIET:  2000 calories ADA diet.  FOLLOWUP:  The patient will be seen in followup at the nursing home.     Kingsley Callander. Ouida Sills, MD     ROF/MEDQ  D:  11/04/2010  T:  11/04/2010  Job:  [id number]  Electronically Signed by Carylon Perches MD on 11/08/2010 08:37:04 AM

## 2010-11-08 NOTE — Progress Notes (Signed)
  [name], [name]                  ACCOUNT NO.:  [id number]  MEDICAL RECORD NO.:  [id number]           PATIENT TYPE:  LOCATION:                                 FACILITY:  PHYSICIAN:  Kingsley Callander. Ouida Sills, MD       DATE OF BIRTH:  Oct 27, 1917  DATE OF PROCEDURE:  10/31/2010 DATE OF DISCHARGE:                                PROGRESS NOTE   Destiny [name] has had a cough with green sputum production overnight.  She had a maximum temperature of 99.6 which was on admission, her temperature this morning is 98.2 with a pulse of 71, respirations 20, blood pressure 111/64 and an oxygen saturation of 96% on 2 L.  She is alert and breathing comfortably.  She is more engaged psychologically today and speaks and interacts more at her baseline.  Lungs reveal bilateral rhonchi.  Heart, regular with a grade 2 systolic murmur. Abdomen, soft and nontender with no hepatosplenomegaly.  Extremities revealed no cyanosis, clubbing, or edema.  IMPRESSION/PLAN: 1. Right lower lobe pneumonia.  Continue Rocephin and Zithromax.     Blood cultures are thus far negative. 2. Urinary tract infection.  Her urinalysis revealed a positive     nitrite, but a negative leukocyte esterase.  She had ketones and     protein on her urinalysis and had 3-6 rbc's and 0-2 white cells     with a few bacteria.  Urine culture is pending. 3. Depression.  Continue Zoloft, Wellbutrin, and Zyprexa. 4. Chronic kidney disease with history of type 2 diabetes, creatinine     is 1.3, admitting glucose was 143.     Kingsley Callander. Ouida Sills, MD     ROF/MEDQ  D:  10/31/2010  T:  10/31/2010  Job:  [id number]  Electronically Signed by Carylon Perches MD on 11/08/2010 08:37:06 AM

## 2010-11-08 NOTE — H&P (Signed)
Destiny [name], Destiny [name]                  ACCOUNT NO.:  [id number]  MEDICAL RECORD NO.:  [id number]           PATIENT TYPE:  LOCATION:                                 FACILITY:  PHYSICIAN:  Kingsley Callander. Ouida Sills, MD       DATE OF BIRTH:  03/10/1918  DATE OF ADMISSION:  10/30/2010 DATE OF DISCHARGE:  LH                             HISTORY & PHYSICAL   CHIEF COMPLAINT:  Weakness.  HISTORY OF PRESENT ILLNESS:  This patient is a 77 year old white female, resident of [facility], who presented to the office with generalized weakness increasing over the past week.  She had experienced cough but no significant sputum production.  She had stopped eating or drinking very well over the past 1-2 days.  She had been witnessed having difficulty breathing on the morning of admission and Paramedics had been called.  She refused transportation by the Paramedics to the emergency room but did consent to come to my office.  She was seen at the office in late morning and was minimally communicative.  Her brother was with her.  He had recently made arrangements for her to go to an assisted living facility in [place].  She had been quite upset about this.  She would nearly say that this is the end.  She would not answer questions appropriately.  She appeared weak and dehydrated and was directly admitted from the office.  PAST MEDICAL HISTORY: 1. Right breast cancer treated with mastectomy in 1976. 2. Type 2 diabetes.  Her last hemoglobin A1c was 5.9 last fall. 3. Chronic kidney disease, her last creatinine was 1.69 last fall. 4. Longstanding depression requiring multiple hospitalizations and     followed by Psychiatry. 5. Vitamin B12 deficiency. 6. Spinal stenosis. 7. Osteoporosis. 8. Tonsillectomy. 9. Cataract surgery. 10.Hysterectomy. 11.Appendectomy. 12.Hernia repair. 13.History of right chest surgery. 14.History of right hip fracture. 15.Chronic cough.  MEDICATIONS: 1. Zyprexa 2.5 mg daily. 2.  Wellbutrin XL 300 mg daily. 3. Zoloft 150 mg daily. 4. Lorazepam 0.5 mg p.r.n. 5. Boniva 150 mg monthly. 6. Vitamin B12 injections monthly.  ALLERGIES:  None.  FAMILY HISTORY:  Father died of some type of cancer at 79.  Her mother died of an unknown cause at 18.  Siblings have had diabetes.  SOCIAL HISTORY:  She does not smoke, drink, or use drugs.  She had been living at home with helpers.  REVIEW OF SYSTEMS:  Unobtainable.  PHYSICAL EXAMINATION:  VITAL SIGNS:  Blood pressure 132/74, temperature 99.1, respirations 18, and pulse 88. GENERAL:  Weak-appearing elderly female. HEENT:  Eyes, nose, and pharynx unremarkable. NECK:  No JVD or thyromegaly. LUNGS:  Bilateral rhonchi. HEART:  Regular with a grade 2 systolic murmur. CHEST:  She is status post right mastectomy. ABDOMEN:  Nontender with no hepatosplenomegaly. EXTREMITIES:  No cyanosis, clubbing, or edema.  Pulses intact. NEURO:  No focal weakness. LYMPH NODES:  No cervical or supraclavicular enlargement. SKIN:  Dry.  LABORATORY DATA:  White count 9.8, hemoglobin 11.6 with MCV of 87, platelets 202, 81 segs, 8 lymphs.  ABG on room air reveals a pH of  7.41, pCO2 34 and pO2 of 52.  Comprehensive metabolic panel is normal except for mildly elevated creatinine 1.3 and low albumin.  Chest x-ray reveals a right lower lobe infiltrate consistent with pneumonia.  Urinalysis reveals ketones, protein, bacteria and red cells with positive nitrite.  IMPRESSION/PLAN: 1. Right lower lobe pneumonia.  She will be treated with IV Rocephin     and azithromycin.  Blood cultures were obtained on admission. 2. Dehydration.  We will treat with IV fluids. 3. Urinary tract infection.  Urine culture is being obtained. 4. Diabetes.  Essentially glucoses in the prediabetes range after     losing weight over the last several years. 5. Depression.  Continue current psychiatric medications. 6. Vitamin B12 deficiency.  Continue monthly injections. 7.  Chronic kidney disease, stable. 8. Osteoporosis with a history of right hip fracture, currently on     Boniva. 9. History of spinal stenosis.  She is not requiring pain medications     now. 10.Right breast cancer.  She has had no sign of recurrent disease.  She does have a do not resuscitate status.  The order has been written.     Kingsley Callander. Ouida Sills, MD     ROF/MEDQ  D:  10/31/2010  T:  10/31/2010  Job:  [id number]  Electronically Signed by Carylon Perches MD on 11/08/2010 08:37:02 AM

## 2010-11-08 NOTE — Progress Notes (Signed)
  [name], Destiny [name]                  ACCOUNT NO.:  [id number]  MEDICAL RECORD NO.:  [id number]           PATIENT TYPE:  LOCATION:                                 FACILITY:  PHYSICIAN:  Kingsley Callander. Ouida Sills, MD       DATE OF BIRTH:  [birth year]  DATE OF PROCEDURE:  11/02/2010 DATE OF DISCHARGE:                                PROGRESS NOTE   Increased cough and congestion last night requiring an additional nebulizer treatment.  She was also given a dose of IV Lasix by the on- call physician and was started on Mucinex.  She is breathing comfortably this morning.  She has continued cough of yellow sputum.  She has had no fever over the past 24 hours.  Temperature this morning is 97.8, pulse 81, respirations 20, blood pressure 134/74, oxygen saturation is 93% on 2-1/2 liter by nasal cannula.  Lungs are clear.  Heart, regular with a grade 2 systolic murmur.  Abdomen is soft and nontender with no hepatosplenomegaly.  Extremities revealed no edema or cyanosis.  IMPRESSION/PLAN: 1. Right lower lobe pneumonia.  Blood cultures remain negative.  Her     sputum culture reveals some moderate Gram-positive cocci in pairs     and few Gram-negative rods and few Gram-positive rods.  Continue     Rocephin and Zithromax. 2. Urinary tract infection.  Her urine culture reveals 40,000 colonies     of E.coli. 3. Hypokalemia.  Continue potassium supplementation. 4. Diabetes, stable.  IMPRESSION:  Continue current treatment.     Kingsley Callander. Ouida Sills, MD     ROF/MEDQ  D:  11/02/2010  T:  11/02/2010  Job:  [id number]  Electronically Signed by Carylon Perches MD on 11/08/2010 08:37:14 AM

## 2010-11-17 LAB — CBC
MCHC: 33.2 g/dL (ref 30.0–36.0)
MCV: 89.7 fL (ref 78.0–100.0)
Platelets: 161 10*3/uL (ref 150–400)
RDW: 15.6 % — ABNORMAL HIGH (ref 11.5–15.5)

## 2010-11-17 LAB — COMPREHENSIVE METABOLIC PANEL
AST: 20 U/L (ref 0–37)
Albumin: 3.7 g/dL (ref 3.5–5.2)
CO2: 24 mEq/L (ref 19–32)
Calcium: 9.3 mg/dL (ref 8.4–10.5)
Creatinine, Ser: 1.33 mg/dL — ABNORMAL HIGH (ref 0.4–1.2)
GFR calc Af Amer: 45 mL/min — ABNORMAL LOW (ref >60)
GFR calc non Af Amer: 38 mL/min — ABNORMAL LOW (ref >60)

## 2010-11-17 LAB — GLUCOSE, CAPILLARY
Glucose-Capillary: 101 mg/dL — ABNORMAL HIGH (ref 70–99)
Glucose-Capillary: 102 mg/dL — ABNORMAL HIGH (ref 70–99)

## 2010-11-17 LAB — DIFFERENTIAL
Eosinophils Relative: 2 % (ref 0–5)
Lymphocytes Relative: 23 % (ref 12–46)
Lymphs Abs: 1.7 10*3/uL (ref 0.7–4.0)

## 2010-11-25 LAB — URINALYSIS, ROUTINE W REFLEX MICROSCOPIC
Bilirubin Urine: NEGATIVE
Glucose, UA: NEGATIVE mg/dL
Ketones, ur: NEGATIVE mg/dL
Nitrite: NEGATIVE
pH: 5 (ref 5.0–8.0)

## 2010-11-25 LAB — LIPASE, BLOOD: Lipase: 29 U/L (ref 11–59)

## 2010-11-25 LAB — COMPREHENSIVE METABOLIC PANEL
BUN: 18 mg/dL (ref 6–23)
CO2: 25 mEq/L (ref 19–32)
Chloride: 110 mEq/L (ref 96–112)
Creatinine, Ser: 1.12 mg/dL (ref 0.4–1.2)
GFR calc non Af Amer: 46 mL/min — ABNORMAL LOW (ref >60)
Glucose, Bld: 93 mg/dL (ref 70–99)
Total Bilirubin: 0.5 mg/dL (ref 0.3–1.2)

## 2010-11-25 LAB — CBC
HCT: 34 % — ABNORMAL LOW (ref 36.0–46.0)
Hemoglobin: 11.5 g/dL — ABNORMAL LOW (ref 12.0–15.0)
MCV: 90.6 fL (ref 78.0–100.0)
RBC: 3.76 MIL/uL — ABNORMAL LOW (ref 3.87–5.11)
WBC: 6 10*3/uL (ref 4.0–10.5)

## 2010-11-25 LAB — DIFFERENTIAL
Basophils Absolute: 0.1 10*3/uL (ref 0.0–0.1)
Basophils Relative: 2 % — ABNORMAL HIGH (ref 0–1)
Eosinophils Relative: 3 % (ref 0–5)
Lymphocytes Relative: 26 % (ref 12–46)
Neutro Abs: 3.7 10*3/uL (ref 1.7–7.7)

## 2010-11-26 LAB — DIFFERENTIAL
Basophils Absolute: 0 10*3/uL (ref 0.0–0.1)
Basophils Absolute: 0 10*3/uL (ref 0.0–0.1)
Basophils Relative: 0 % (ref 0–1)
Eosinophils Relative: 0 % (ref 0–5)
Lymphocytes Relative: 10 % — ABNORMAL LOW (ref 12–46)
Lymphs Abs: 1.2 10*3/uL (ref 0.7–4.0)
Monocytes Absolute: 0.6 10*3/uL (ref 0.1–1.0)
Neutro Abs: 10.4 10*3/uL — ABNORMAL HIGH (ref 1.7–7.7)
Neutro Abs: 5.1 10*3/uL (ref 1.7–7.7)
Neutrophils Relative %: 73 % (ref 43–77)

## 2010-11-26 LAB — ETHANOL: Alcohol, Ethyl (B): <5 mg/dL (ref 0–10)

## 2010-11-26 LAB — CBC
HCT: 39.2 % (ref 36.0–46.0)
HCT: 31.2 % — ABNORMAL LOW (ref 36.0–46.0)
Hemoglobin: 10.5 g/dL — ABNORMAL LOW (ref 12.0–15.0)
Platelets: 207 10*3/uL (ref 150–400)
WBC: 12 10*3/uL — ABNORMAL HIGH (ref 4.0–10.5)
WBC: 7.1 10*3/uL (ref 4.0–10.5)

## 2010-11-26 LAB — URINALYSIS, ROUTINE W REFLEX MICROSCOPIC
Glucose, UA: NEGATIVE mg/dL
Ketones, ur: NEGATIVE mg/dL
Leukocytes, UA: NEGATIVE
Nitrite: NEGATIVE
Nitrite: NEGATIVE
Protein, ur: NEGATIVE mg/dL
Specific Gravity, Urine: >1.03 — ABNORMAL HIGH (ref 1.005–1.030)
pH: 6 (ref 5.0–8.0)
pH: 5.5 (ref 5.0–8.0)

## 2010-11-26 LAB — BASIC METABOLIC PANEL
BUN: 25 mg/dL — ABNORMAL HIGH (ref 6–23)
Calcium: 9.9 mg/dL (ref 8.4–10.5)
GFR calc non Af Amer: 36 mL/min — ABNORMAL LOW (ref >60)
Potassium: 4.3 mEq/L (ref 3.5–5.1)
Sodium: 141 mEq/L (ref 135–145)

## 2010-11-26 LAB — URINE MICROSCOPIC-ADD ON

## 2010-11-26 LAB — COMPREHENSIVE METABOLIC PANEL
Albumin: 2.5 g/dL — ABNORMAL LOW (ref 3.5–5.2)
Alkaline Phosphatase: 47 U/L (ref 39–117)
BUN: 17 mg/dL (ref 6–23)
Chloride: 107 mEq/L (ref 96–112)
Glucose, Bld: 101 mg/dL — ABNORMAL HIGH (ref 70–99)
Potassium: 3.2 mEq/L — ABNORMAL LOW (ref 3.5–5.1)
Total Bilirubin: 0.4 mg/dL (ref 0.3–1.2)

## 2010-12-01 LAB — DIFFERENTIAL
Basophils Relative: 0 % (ref 0–1)
Eosinophils Absolute: 0 10*3/uL (ref 0.0–0.7)
Eosinophils Relative: 0 % (ref 0–5)
Lymphs Abs: 0.9 10*3/uL (ref 0.7–4.0)
Monocytes Absolute: 0.5 10*3/uL (ref 0.1–1.0)
Monocytes Relative: 4 % (ref 3–12)

## 2010-12-01 LAB — URINALYSIS, ROUTINE W REFLEX MICROSCOPIC
Nitrite: POSITIVE — AB
Protein, ur: 30 mg/dL — AB
Specific Gravity, Urine: >1.03 — ABNORMAL HIGH (ref 1.005–1.030)
Urobilinogen, UA: 0.2 mg/dL (ref 0.0–1.0)

## 2010-12-01 LAB — BASIC METABOLIC PANEL
CO2: 21 mEq/L (ref 19–32)
Chloride: 101 mEq/L (ref 96–112)
Creatinine, Ser: 1.33 mg/dL — ABNORMAL HIGH (ref 0.4–1.2)
GFR calc Af Amer: 46 mL/min — ABNORMAL LOW (ref >60)
Potassium: 3.8 mEq/L (ref 3.5–5.1)
Sodium: 137 mEq/L (ref 135–145)

## 2010-12-01 LAB — CBC
HCT: 35 % — ABNORMAL LOW (ref 36.0–46.0)
Hemoglobin: 11.9 g/dL — ABNORMAL LOW (ref 12.0–15.0)
MCHC: 34 g/dL (ref 30.0–36.0)
MCV: 91.8 fL (ref 78.0–100.0)
RBC: 3.82 MIL/uL — ABNORMAL LOW (ref 3.87–5.11)

## 2010-12-01 LAB — URINE MICROSCOPIC-ADD ON

## 2010-12-15 ENCOUNTER — Encounter (HOSPITAL_COMMUNITY): Payer: No Typology Code available for payment source | Admitting: Psychiatry

## 2010-12-19 ENCOUNTER — Inpatient Hospital Stay (HOSPITAL_COMMUNITY)
Admission: EM | Admit: 2010-12-19 | Discharge: 2010-12-21 | DRG: 089 | Disposition: A | Discharge status: Skilled Nursing Facility | Payer: No Typology Code available for payment source | Attending: Internal Medicine | Admitting: Internal Medicine

## 2010-12-19 ENCOUNTER — Emergency Department (HOSPITAL_COMMUNITY): Payer: No Typology Code available for payment source

## 2010-12-19 DIAGNOSIS — W19XXXA Unspecified fall, initial encounter: Secondary | ICD-10-CM | POA: Diagnosis present

## 2010-12-19 DIAGNOSIS — S0003XA Contusion of scalp, initial encounter: Secondary | ICD-10-CM | POA: Diagnosis present

## 2010-12-19 DIAGNOSIS — S1093XA Contusion of unspecified part of neck, initial encounter: Secondary | ICD-10-CM | POA: Diagnosis present

## 2010-12-19 DIAGNOSIS — E119 Type 2 diabetes mellitus without complications: Secondary | ICD-10-CM | POA: Diagnosis present

## 2010-12-19 DIAGNOSIS — N189 Chronic kidney disease, unspecified: Secondary | ICD-10-CM | POA: Diagnosis present

## 2010-12-19 DIAGNOSIS — F329 Major depressive disorder, single episode, unspecified: Secondary | ICD-10-CM | POA: Diagnosis present

## 2010-12-19 DIAGNOSIS — N390 Urinary tract infection, site not specified: Secondary | ICD-10-CM | POA: Diagnosis present

## 2010-12-19 DIAGNOSIS — S060X0A Concussion without loss of consciousness, initial encounter: Principal | ICD-10-CM | POA: Diagnosis present

## 2010-12-19 DIAGNOSIS — M810 Age-related osteoporosis without current pathological fracture: Secondary | ICD-10-CM | POA: Diagnosis present

## 2010-12-19 DIAGNOSIS — I129 Hypertensive chronic kidney disease with stage 1 through stage 4 chronic kidney disease, or unspecified chronic kidney disease: Secondary | ICD-10-CM | POA: Diagnosis present

## 2010-12-19 DIAGNOSIS — E538 Deficiency of other specified B group vitamins: Secondary | ICD-10-CM | POA: Diagnosis present

## 2010-12-19 LAB — URINE MICROSCOPIC-ADD ON

## 2010-12-19 LAB — URINALYSIS, ROUTINE W REFLEX MICROSCOPIC
Bilirubin Urine: NEGATIVE
Leukocytes, UA: NEGATIVE
Nitrite: NEGATIVE
Specific Gravity, Urine: 1.02 (ref 1.005–1.030)
pH: 6 (ref 5.0–8.0)

## 2010-12-19 LAB — POCT I-STAT, CHEM 8
Chloride: 107 mEq/L (ref 96–112)
Creatinine, Ser: 1.4 mg/dL — ABNORMAL HIGH (ref 0.4–1.2)
Glucose, Bld: 88 mg/dL (ref 70–99)
Potassium: 4.1 mEq/L (ref 3.5–5.1)
Sodium: 141 mEq/L (ref 135–145)

## 2010-12-19 LAB — MRSA PCR SCREENING: MRSA by PCR: POSITIVE — AB

## 2010-12-20 LAB — BASIC METABOLIC PANEL
GFR calc non Af Amer: 52 mL/min — ABNORMAL LOW (ref >60)
Glucose, Bld: 89 mg/dL (ref 70–99)
Potassium: 4.1 mEq/L (ref 3.5–5.1)
Sodium: 142 mEq/L (ref 135–145)

## 2010-12-23 LAB — URINE CULTURE: Culture  Setup Time: 201205051957

## 2010-12-27 NOTE — Group Therapy Note (Signed)
  NAMEALISCIA, Destiny [name]                  ACCOUNT NO.:  [id number]  MEDICAL RECORD NO.:  [id number]           PATIENT TYPE:  I  LOCATION:  A215                          FACILITY:  [facility]  PHYSICIAN:  Kingsley Callander. Ouida Sills, MD       DATE OF BIRTH:  [birth year]  DATE OF PROCEDURE: DATE OF DISCHARGE:                                PROGRESS NOTE   Destiny [name] was admitted yesterday after falling at [facility] and injuring her head.  She sustained a bruise and shallow laceration to the right side of her head.  Her CT scan revealed no evidence of fracture or subdural hematoma.  There was evidence of a right scalp hematoma on CT.  CT of her cervical spine revealed multilevel cervical spondylosis, but no evidence of acute fracture or dislocation.  She has been somewhat confused.  She has called this morning, but was uncertain as to where she was.  PHYSICAL EXAMINATION:  VITAL SIGNS:  Temperature 97.7, pulse 64, respirations 18, blood pressure 122/68, oxygen saturation 95%. HEENT:  There is no bleeding from the right side of her scalp, bruising and mild swelling are present. LUNGS:  Clear. HEART:  Regular with a grade 2 systolic murmur. ABDOMEN:  Soft and nontender with no hepatosplenomegaly. EXTREMITIES:  Reveal no edema. NEUROLOGIC:  Otherwise at baseline.  IMPRESSION/PLAN: 1. Head trauma, possible concussion, continue observation. 2. History of depression with psychosis.  Continue Zoloft, Wellbutrin,     and Ativan. 3. Recent pneumonia, resolved. 4. Chronic kidney disease.  BUN and creatinine are 22 and 1.0 today     after being 21 and 1.4 yesterday.  IV fluids can be discontinued. 5. Possible urinary tract infection.  She has 3-6 white cells and 3-6     red cells with many bacteria on urinalysis.  She is completely     asymptomatic, however.  Urine culture has been ordered. 6. Mild anemia.  Hemoglobin is 10.9.  Last hemoglobin had been 10.1 in     March. 7. Diabetes.  She has been stable on dietary  therapy.  Her glucose     this morning is 89.     Kingsley Callander. Ouida Sills, MD    ROF/MEDQ  D:  12/20/2010  T:  12/20/2010  Job:  [id number]  Electronically Signed by Carylon Perches MD on 12/27/2010 10:00:21 AM

## 2010-12-27 NOTE — Discharge Summary (Signed)
  Destiny [name], Destiny [name]                  ACCOUNT NO.:  [id number]  MEDICAL RECORD NO.:  [id number]           PATIENT TYPE:  I  LOCATION:  A215                          FACILITY:  [facility]  PHYSICIAN:  Kingsley Callander. Ouida Sills, MD       DATE OF BIRTH:  29-Jul-1918  DATE OF ADMISSION:  12/19/2010 DATE OF DISCHARGE:  05/07/2012LH                         DISCHARGE SUMMARY-REFERRING   DISCHARGE DIAGNOSES: 1. Head trauma with concussion. 2. Scalp hematoma. 3. Depression with psychosis. 4. Chronic kidney disease. 5. Type 2 diabetes. 6. Recent pneumonia. 7. Vitamin B12 deficiency. 8. Spinal stenosis. 9. Osteoporosis. 10.Possible urinary tract infection.  PROCEDURES:  CT of the head, CT of the cervical spine, chest x-ray, right knee x-ray.  HOSPITAL COURSE:  This patient is a 77 year old female, resident of [facility] who fell and struck her head.  She underwent a CT scan of the brain which revealed a right scalp hematoma.  She experienced some confusion following this.  She was hospitalized and underwent frequent neurologic checks.  Mental status has returned to baseline.  She is improved and stable for discharge on the 7th.  Her CT revealed no intracranial abnormality.  She did have stable atrophy with evidence of chronic small vessel ischemic changes.  A CT scan of the cervical spine revealed cervical spondylosis at multiple levels with no evidence of fracture or dislocation.  An x-ray of the chest revealed resolved airspace disease in the right lower lung with no acute findings.  An x- ray of the right knee revealed severe osteoarthritis, but no fracture.  Her urinalysis revealed a negative leukocyte esterase, although she had many bacteria with 3-6 wbc's, 3-6 rbc's and a few squamous epithelial cells.  Her initial BUN and creatinine were 21 and 1.4, and improved to 22 and 1.0, glucose was 89.  She was stable from a respiratory standpoint.  Oxygen was discontinued and she is maintaining oxygen  saturations of 95% and 97% on room air.  She has been stable from a psychiatric standpoint on her usual regimen.  As stated, she will be transferred back to [facility] on the 7th.  DISCHARGE MEDICATIONS: 1. Zoloft 150 mg daily. 2. Wellbutrin XL 300 mg daily. 3. Lorazepam 0.5 mg t.i.d. 4. Zyprexa 2.5 mg nightly.  CODE STATUS:  Do not resuscitate.  FOLLOWUP:  The patient will be seen at [facility].     Kingsley Callander. Ouida Sills, MD     ROF/MEDQ  D:  12/21/2010  T:  12/21/2010  Job:  [id number]  Electronically Signed by Carylon Perches MD on 12/27/2010 10:00:20 AM

## 2010-12-29 NOTE — H&P (Signed)
NAMESIGNE, TACKITT                  ACCOUNT NO.:  [id number]   MEDICAL RECORD NO.:  [id number]           PATIENT TYPE:  OBV   LOCATION:  A301                          FACILITY:  [facility]   PHYSICIAN:  Kingsley Callander. Ouida Sills, MD       DATE OF BIRTH:  [birth year]   DATE OF ADMISSION:  DATE OF DISCHARGE:  LH                              HISTORY & PHYSICAL   CHIEF COMPLAINT:  Left arm pain.   HISTORY OF PRESENT ILLNESS:  This patient is an 77 year old white female  who presented to the emergency room by ambulance after awakening from  sleep and experiencing pain in her left arm.  She had evidently  originally complained of chest pain as well, but at the time of my exam  she down-played this and stated that only her left arm had felt tingly  and numb.  She was symptom free at the time of my exam.  She denied  diaphoresis, vomiting, or syncope.  She now believes she just became  nervous.  She states she became a little more short of breath.  Initial  evaluation revealed normal cardiac markers and normal EKG.  She was  hospitalized though for observation.  She does not have a history of  known coronary artery disease.  She has had diabetes and  hypertriglyceridemia.  She does not smoke.  She has a history of breast  cancer and underwent right mastectomy in 1976.   PAST MEDICAL HISTORY:  1. Breast cancer.  2. Diabetes.  3. Hypertriglyceridemia.  4. Depression/dementia.  5. Vitamin B12 deficiency.  6. DJD/spinal stenosis.  7. Colon polyps.  8. Chronic cough/COPD.  9. Osteoporosis status post right hip fracture.  10.Hysterectomy.  11.Tonsillectomy.  12.Appendectomy.  13.Cataract surgery.  14.Ventral hernia repair.   MEDICATIONS:  1. Wellbutrin XL 300 mg daily.  2. Zyprexa 7.5 mg daily.  3. Zoloft 150 mg daily.  4. Aricept 10 mg daily.  5. Enablex 7.5 mg daily.   ALLERGIES:  None.   SOCIAL HISTORY:  She does not smoke cigarettes, drink alcohol, or use  drugs.   FAMILY HISTORY:  Multiple  family members have had diabetes.  Her father  died of an unknown type of cancer at 36.  Her mother died of an unknown  cause at 22.   REVIEW OF SYSTEMS:  She denies chest pain, fever, or any new cough type  trouble.  No problems with bowel habits.  She has recently experienced  overactive bladder symptoms, although these improved with recent  initiation of Enablex on June 18, 2008.   PHYSICAL EXAMINATION:  GENERAL:  Alert and in no distress.  HEENT:  No scleral icterus.  Pharynx is moist.  NECK:  Supple with no JVD or thyromegaly or bruit.  LUNGS:  Clear.  HEART:  Regular with a grade 2 systolic murmur.  ABDOMEN:  Nontender.  No hepatosplenomegaly.  CHEST:  Status post right mastectomy.  EXTREMITIES:  Normal pulses.  No calf tenderness.  No cyanosis,  clubbing, or edema.  The left arm now is nontender with normal  sensation.  NEUROLOGIC:  At baseline.  LYMPH NODES:  No cervical or supraclavicular enlargement.   LABORATORY DATA:  CBC, MET-7, and cardiac markers are unremarkable.  Her  EKG reveals normal sinus rhythm with no acute ischemic change.  Her  chest x-ray reveals no infiltrate.   IMPRESSION:  1. Chest pain.  She will be observed and serial cardiac enzymes will      be obtained.  2. Diabetes, stable on dietary therapy.  3. Hypertriglyceridemia, recent level was 246.  4. Depression/dementia.  Continue current therapy.  5. Spinal stenosis, stable.  6. History of chronic cough.  7. Osteoporosis.      Kingsley Callander. Ouida Sills, MD  Electronically Signed     ROF/MEDQ  D:  07/04/2008  T:  07/04/2008  Job:  [id number]

## 2010-12-29 NOTE — Discharge Summary (Signed)
Destiny [name], Destiny [name]                  ACCOUNT NO.:  [id number]   MEDICAL RECORD NO.:  [id number]           PATIENT TYPE:  OBV   LOCATION:  A301                          FACILITY:  [facility]   PHYSICIAN:  Kingsley Callander. Ouida Sills, MD       DATE OF BIRTH:  21-Jul-1918   DATE OF ADMISSION:  07/04/2008  DATE OF DISCHARGE:  11/20/2009LH                               DISCHARGE SUMMARY   DISCHARGE DIAGNOSES:  1. Chest pain.  2. Type 2 diabetes.  3. Depression.  4. Dementia.  5. Hyperlipidemia.  6. Vitamin B12 deficiency.  7. Degenerative joint disease.  8. Spinal stenosis.  9. Osteoporosis.  10.Chronic obstructive pulmonary disease.   HOSPITAL COURSE:  This patient is an 77 year old female who presented  with chest pain and left arm pain.  Her EKG revealed no definite  ischemia.  She was hospitalized for observation and telemetry.  Three  sets of troponins were normal.  Her symptoms resolved and did not recur.  Her symptoms were felt to be atypical for angina.  A repeat EKG revealed  no ischemic changes.  She was stable for discharge home on July 05, 2008.  She will be seen in followup in my office in the next 1-2 weeks.  Additional evaluation such as with stress testing will be discussed at  that point.   She has been, otherwise, stable from a medical standpoint.  She believes  she just became anxious on the night of admission.  She has had  longstanding behavioral health issues.   DISCHARGE MEDICATIONS:  1. Wellbutrin XL 300 mg daily.  2. Zyprexa 7.5 mg daily.  3. Zoloft 150 mg daily.  4. Aricept 10 mg daily.  5. Enablex 7.5 mg daily.  6. Lorazepam 0.5 mg p.r.n.     Kingsley Callander. Ouida Sills, MD  Electronically Signed    ROF/MEDQ  D:  07/05/2008  T:  07/05/2008  Job:  [id number]

## 2010-12-29 NOTE — H&P (Signed)
Destiny [name], Destiny [name]                  ACCOUNT NO.:  [id number]   MEDICAL RECORD NO.:  [id number]          PATIENT TYPE:  AMB   LOCATION:  DAY                           FACILITY:  [facility]   PHYSICIAN:  R. Roetta Sessions, M.D. DATE OF BIRTH:  02/14/1918   DATE OF ADMISSION:  DATE OF DISCHARGE:  LH                              HISTORY & PHYSICAL   PRIMARY CARE PHYSICIAN:  Kingsley Callander. Ouida Sills, MD.   REASON FOR CONSULTATION:  Follow up history of villous adenoma.   HISTORY OF PRESENT ILLNESS:  Destiny [name] is an 77 year old female who  underwent colonoscopy in 2000.  Initially, she had multiple  tubulovillous adenomas removed from her colon.  She had surveillance  colonoscopy on November 18, 2004, and had a 1-cm inflamed fragments of  colonic mucosa.  The mid descending colon polyp was a villous adenoma  0.75 cm, and there was a polyp at 35 cm which was an inflamed focally  adenomatous polyps as well.  She has not had any problems since  colonoscopy.  She denies any rectal bleeding or melena.  Her only  concern today is chronic constipation which has been a lifelong problem  for her.  She has tried some Metamucil which does seem to help.  She  denies any abdominal pain, fever, chills, weight loss, anorexia,  heartburn, indigestion, dysphagia, odynophagia, nausea, or vomiting.   PAST MEDICAL AND SURGICAL HISTORY:  Diabetes mellitus, anxiety, and  COPD.  She has history of breast carcinoma status post right mastectomy.  She has history of multiple adenomatous polyps as described in HPI.  She  is status post appendectomy and hysterectomy.   CURRENT MEDICATIONS:  1. Boniva 150 mg monthly.  2. Meclizine 25 mg t.i.d. p.r.n.  3. Lorazepam 0.5 mg daily p.r.n.  4. Wellbutrin XL 3 mg daily.  5. Zoloft 150 mg daily.  6. Zyprexa 7.5 mg nightly.   ALLERGIES:  No known drug allergies.   FAMILY HISTORY:  There was no known family history of carcinoma, liver  chronic GI problems.  Mother and father are both  deceased, the etiology  unknown.  She has lost one brother to brain tumor, one to coronary  artery disease and MI, and one unknown.  She has 4 healthy brothers.   SOCIAL HISTORY:  Destiny [name] is a widow.  She lives with a friend.  She is  retired from [employer].  She has a 40-pack-year history of tobacco  use, quit 30 years ago.  Denies any alcohol or drug use.   REVIEW OF SYSTEMS:  See HPI, otherwise negative.   PHYSICAL EXAM:  VITAL SIGNS:  Weight 157 pounds, height 63 inches, temp  97.3, blood pressure 120/80, and pulse 60.  GENERAL:  Destiny [name] is a well-developed, well-nourished elderly female in  no acute distress.  HEENT:  Sclerae clear and nonicteric.  Conjunctiva pink.  Oropharynx  pink and moist without any lesions.  NECK:  Supple without mass or thyromegaly.  CHEST:  Heart regular rate and rhythm.  Normal S1 and S2 without  murmurs, rubs, or gallops.  LUNGS: Clear to auscultation bilaterally.  ABDOMEN:  Positive bowel sounds x4.  No bruits auscultated.  Soft,  nontender, and nondistended without palpable mass or hepatosplenomegaly.  No rebound, tenderness, or guarding.  EXTREMITIES:  Without clubbing or edema bilaterally.  SKIN:  Pink, warm, and dry without any rash or jaundice.   IMPRESSION:  Destiny [name] is a 77 year old female with multiple adenomatous  and villous adenomatous polyps on last 2 colonoscopies.  She is due for  surveillance colonoscopy at this time.  Given her age, we did discuss  risk and benefits; however, she is not wanting to pursue a colonoscopy  at this time.  If she changes her mind, we will be gladly willing to  pursue colonoscopy with Destiny [name].   IMPRESSION:  She has chronic constipation.   PLAN:  1. Constipation literature given for review.  2. Colonoscopy with Dr. Jena Gauss in the near future.  If she decides to      do this, she will give [place] a call back.  3. Suggest GlycoLax 17 grams daily p.r.n. constipation #5, 27 grams      with one refill.    Thank you Dr. Ouida Sills for allowing [place] to participate in the care of Ms.  [name].      Lorenza Burton, N.P.      Jonathon Bellows, M.D.  Electronically Signed    KJ/MEDQ  D:  12/26/2007  T:  12/27/2007  Job:  [id number]   cc:   Kingsley Callander. Ouida Sills, MD  Fax: [phone]

## 2011-01-01 NOTE — Procedures (Signed)
[facility]  Patient:    Destiny [name], Destiny [name]                         MRN: [id number] Proc. Date: 09/13/00 Adm. Date:  [id number] Attending:  Silvio Pate CC:         Carylon Perches, M.D., [place], [place].                           Procedure Report  PROCEDURE:  Flexible fiberoptic bronchoscopy with bronchoalveolar lavage.  INDICATIONS FOR PROCEDURE:  Chronic cough of unknown etiology.  SURGEON:  Dr. Shelle Iron.  ANESTHESIA:  Demerol 50 mg IV, Versed 7.5 mg IV and topical 1% lidocaine to the vocal cords and airways during the procedure.  DESCRIPTION OF PROCEDURE:  After obtaining informed consent and under close cardiopulmonary monitoring, the above preop anesthesia was given and the fiberoptic scope was passed through the right naris and into the posterior oropharynx where there were no lesions or other abnormalities seen. There was a large amount of mucus that was suctioned and with each cough, she seemed to have more mucus coming out posterior to the larynx rather than from the airway itself. That raises the question of whether this is GERD. The vocal cords appear to be within normal limits and move bilaterally with phonation. The scope was then passed into the trachea where it was examined along its entire length down to the level of the carina all of which was normal. The left and right tracheobronchial trees were examined serially with no endobronchial abnormality being found; however, there was large amounts of small mucus plugs suctioned from the distal airways more dependently. These appeared to be non-purulent. During the procedure while the lower airways were being examined, there was clearly a flow of mucus from above draining down into the lower airway. Again this raises the question of open reflux which is spilling over into the larynx and down into the lower lung zones. Bronchoalveolar lavage was done from the lower lobe and also from the  right upper lobe given the chronic CT changes that I believe are stable and probably non-pathologic at this point. Good material was obtained with small plugs noted in the bottom of the lavage collection system after completed. This will be sent for the usual bacteriologic and cytologic evaluation, but I will also ask them to do a cell count and differential as well as looking for vegetable matter and fat globules. Overall, the patient tolerated the procedure quite well and there were no complications. DD:  09/13/00 TD:  09/13/00 Job: [id number]

## 2011-01-01 NOTE — Group Therapy Note (Signed)
NAMESHERITHA, LOUIS                  ACCOUNT NO.:  [id number]   MEDICAL RECORD NO.:  [id number]          PATIENT TYPE:  INP   LOCATION:  A339                          FACILITY:  [facility]   PHYSICIAN:  Edward L. Juanetta Gosling, M.D.DATE OF BIRTH:  08/05/1918   DATE OF PROCEDURE:  07/02/2006  DATE OF DISCHARGE:                                   PROGRESS NOTE   This is a patient of Dr. Romeo Apple and Dr. Ouida Sills.  Ms.  Sherfield had an episode  of hypoxia with essentially unknown cause.  She had surgery this morning,  had fixation of her right hip.  I saw her  briefly last night, and she was  confused but awake.  Now she is drowsy from having had the surgery, but her  chest sounds pretty clear.  Her heart is regular. Her abdomen is soft, and  she looks pretty comfortable.  Her temperature is 97.4, pulse 82,  respirations 18, blood sugar 146, blood pressure 138/68, O2 saturation 93%  on 3 liters.   I will plan to continue with her treatments.  She does look better than last  night.      Edward L. Juanetta Gosling, M.D.  Electronically Signed     ELH/MEDQ  D:  07/02/2006  T:  07/02/2006  Job:  [id number]

## 2011-01-01 NOTE — Discharge Summary (Signed)
NAMEBRYTON, WAIGHT                            ACCOUNT NO.:  [id number]   MEDICAL RECORD NO.:  [id number]                   PATIENT TYPE:  IPS   LOCATION:  0406                                 [facility]  [facility]   PHYSICIAN:  Jeanice Lim, M.D.              DATE OF BIRTH:  March 03, 1918   DATE OF ADMISSION:  12/11/2003  DATE OF DISCHARGE:  12/24/2003                                 DISCHARGE SUMMARY   CONTINUATION   MENTAL STATUS EXAM:  Alert, elderly female, cooperative, good eye contact.  Speech: Clear.  Mood: Sad.  Affect: Somewhat restricted.  Thought process:  Mostly goal directed, perseverating on severity of anxiety, falling apart on  the inside, feeling that she could not take it any more, that she could not  possibly live through the degree of anxiety.  She was experiencing  generalized anxiety symptoms, depressive symptoms, and then panic.  The  panic was the worst complaint.  Cognitive: Grossly intact.  Judgment and  insight: Somewhat limited due to the severity of distress.   ADMISSION DIAGNOSES:   AXIS I:  1. Generalized anxiety disorder.  2. Panic disorder without agoraphobia.  3. Major depression, moderate, recurrent.   AXIS II:  Deferred.   AXIS III:  Diet controlled diabetes.   AXIS IV:  Moderate problems with psychosocial stressors, limited support  system, and medical problems.   AXIS V:  [id number]   HOSPITAL COURSE:  The patient was admitted, ordered routine p.r.n.  medications, underwent further monitoring, and was encouraged to participate  in individual, group, and milieu therapy.  The patient was placed on safety  checks and fall risk precautions.  The patient clearly had done well on low  dose benzodiazepines without having to escalate dose for years with no  history of abuse.  However, this medication had been discontinued and each  time it was discontinued, she would decompensate.  Therefore, risk-benefit  ratio and alternative treatments were  discussed regarding benzodiazepines  but again, these were resumed and the patient gradually reported  improvement.  She wanted to make sure this time that she had consistent  improvement and not leave the hospital and then fall apart.  She was very  fearful with a lot of anticipatory anxiety regarding a fear of having a  panic attack but had no panic attacks after the first couple of days of  hospitalization when she was stabilized on medications.  She tolerated the  medications without side effects, was seen by internal medicine regarding  chronic bronchitis, history of anemia, diabetes mellitus, and slightly low  potassium.  All recommendations were followed by internal medicine  consultant.   CONDITION ON DISCHARGE:  The patient was discharged in markedly improved  condition with no medical complaints.  Mood was more euthymic.  Affect:  Brighter.  Much more calm, no panic, no significant anxiety, no dangerous  ideation, feeling that she could  cope better with her level of distress and  pressures outside the hospital.  She was given medication education.   DISCHARGE MEDICATIONS:  1. Vicodin 5/500 mg every eight hours p.r.n. pain.  2. Ditropan 5 mg b.i.d.  3. Ativan 0.5 mg t.i.d. and 0.75 mg q.h.s.  The patient was also advised to     take two of the 0.5 mg once per day only as needed for severe panic     attacks, which, again, was her most distressing experience prior to     admission.  4. Zoloft 50 mg two q.a.m. and one and a half at 1 p.m.  5. Zyprexa 10 mg q.h.s.  6. Humibid LA 600 mg b.i.d. for 14 days.   FOLLOW UP:  The patient was to follow up with Katrine Coho, Counselor of Aging  assessment on May 11, and [facility] with [facility] Grout. Lolly Mustache, M.D., on June 9 at 9:15 and Hershal Coria, Psy.D., on June 7  at 1:15.   DISCHARGE DIAGNOSES:   AXIS I:  1. Generalized anxiety disorder.  2. Panic disorder without agoraphobia.  3. Major depression,  moderate, recurrent.   AXIS II:  Deferred.   AXIS III:  Diet controlled diabetes.   AXIS IV:  Moderate problems with psychosocial stressors, limited support  system, and medical problems.   AXIS V:  Global assessment of functioning on discharge was 55-60.                                               Jeanice Lim, M.D.    JEM/MEDQ  D:  01/26/2004  T:  01/26/2004  Job:  [id number]

## 2011-01-01 NOTE — Discharge Summary (Signed)
NAMENOREENE, BOREMAN                  ACCOUNT NO.:  [id number]   MEDICAL RECORD NO.:  [id number]          PATIENT TYPE:  INP   LOCATION:  A339                          FACILITY:  [facility]   PHYSICIAN:  Vickki Hearing, M.D.DATE OF BIRTH:  1918/03/18   DATE OF ADMISSION:  06/29/2006  DATE OF DISCHARGE:  11/22/2007LH                               DISCHARGE SUMMARY   ADDENDUM:  The patient has been arranged to transfer to [facility].  She has  a right femoral neck fracture treated with three cannulated screws.  She  needs to take 20 mg of K-Dur daily as an addendum to the medication  list.  Everything else stays the same.      Vickki Hearing, M.D.  Electronically Signed     SEH/MEDQ  D:  07/07/2006  T:  07/07/2006  Job:  [id number]

## 2011-01-01 NOTE — Discharge Summary (Signed)
[facility]  Patient:    JAZEL, NIMMONS                       MRN: [id number] Adm. Date:  [id number] Disc. Date: [id number] Attending:  Katha Cabal CC:         Barbaraann Share, M.D. [facility]   Discharge Summary  ADMISSION DIAGNOSIS:  Severe chronic cough felt to be related to reflux from a large hiatal hernia.  PROCEDURES:  Laparoscopic repair of large hiatal hernia and Nissen fundoplication on January 18, 2001.  HOSPITAL COURSE:  Marsella Suman is an 77 year old lady from [place], [place], who underwent the above-mentioned operation.  She was seen by Barbaraann Share, M.D., in the hospital and basically had a fairly unremarkable postoperative course.  She was ready for discharge on January 23, 2001.  She had a normal upper GI.  She was eating grits.  Discharge planning was made.  She was going to take over-the-counter pain medications.  Return in one to two weeks. DD:  02/04/01 TD:  02/06/01 Job: [id number] SAY/[id number]

## 2011-01-01 NOTE — H&P (Signed)
Destiny [name], Destiny [name]                            ACCOUNT NO.:  [id number]   MEDICAL RECORD NO.:  [id number]                   PATIENT TYPE:  INP   LOCATION:  A313                                 FACILITY:  [facility]   PHYSICIAN:  Sarita Bottom, M.D.                  DATE OF BIRTH:  05/07/1918   DATE OF ADMISSION:  08/03/2002  DATE OF DISCHARGE:                                HISTORY & PHYSICAL   PRIMARY CARE PHYSICIAN:  Kingsley Callander. Ouida Sills, M.D.   CHIEF COMPLAINT:  I have pain in my belly.   HISTORY OF PRESENT ILLNESS:  The patient is an 77 year old lady with a  history of breast cancer, depression, anxiety, and history of intestinal  obstruction.  She says she has been having abdominal pain on and off for the  past one month which has gotten worse over the past few days.  She, however,  denies any vomiting or any constipation; denies any fever or dysuria.  She  denies any dizziness.  She said her appetite is could.  She decided to come  to the emergency room on account of the persistent abdominal pain.   PAST MEDICAL HISTORY:  1. Hysterectomy and appendectomy.  2. She has had episodes of intestinal obstruction for which she has been     admitted to [facility].  3. History of breast cancer about 20 years ago.  She is status post right     mastectomy.  4. History of gastroesophageal reflux disease.   MEDICATIONS:  Zoloft, Xanax, and Antivert.   ALLERGIES:  No known drug allergies.   FAMILY HISTORY:  Significant for diabetes in some of her brothers.   SOCIAL HISTORY:  She is widow, she has no children.  She does not smoke or  drink alcohol.   PHYSICAL EXAMINATION:  VITAL SIGNS:  Blood pressure 173/83.  GENERAL:  She is an elderly lady lying comfortably on a stretcher.  She does  not seem to be in any form of distress.  HEENT:  She is not pale; she is anicteric.  NECK:  Supple.  There is no jugular venous distention, no lymphadenopathy.  CHEST:  Air entry is good bilaterally.   Breath sounds are vesicular.  CARDIOVASCULAR:  Heart sounds 1 and 2 were normal.  Rhythm is regular.  No  murmurs appreciated.  ABDOMEN:  Appears distended.  Bowel sounds are present and hyperactive.  She  has slight diffuse tenderness.  No masses or organomegaly palpated.  NEUROLOGIC:  She is alert and oriented x3.  She has no gross or focal  deficits.  EXTREMITIES:  She has no pedal edema.   LABORATORY DATA:  Chest x-ray significant for slight cardiomegaly, no acute  infiltrate, questionable widened mediastinum.  Abdominal x-ray shows  distended loops of bowel.  Wbc 10.1, hemoglobin 12, hematocrit 36.1, MCV 89,  platelet count 215, neutrophil count 70%, lymphocyte  23%.  Sodium 142,  potassium 2.8, chloride 108, glucose 152, BUN 26, creatinine 1.3, CO2 33,  calcium 9.0.  CK 208, MB 4.5, troponin 0.01.  UA shows many bacteria,  nitrite is positive, wbc's 7-10.  Amylase and lipase are normal.  LFTs  normal.   ASSESSMENT:  1. Partial bowel obstruction.  The patient will be admitted and kept n.p.o.     An NG tube will be passed and connected to suction.  Surgical consult     will be called.  Dr. Malvin Johns has already been spoken to by the ER     doctor.  He will see the patient tomorrow.  The patient will be hydrated     with IV normal saline and D-5-W.  2. Hypokalemia.  The patient will be given IV KCl 10 mEq x3 doses and BMETs     will be monitored.  3. With the pyuria and bacteruria, the patient has a urinary tract     infection.  She will be started on IV Levaquin 500 mg q.d.  4. For the hypertension, the patient's blood pressure will be monitored and     if it continues to be elevated she will be started on Norvasc 10 mg p.o.     q.d.  5. For the questionable widened mediastinum, I will do a CT of the chest to     rule out an aneurysm.   Further management and workup will depend on the patient's clinical course.                                               Sarita Bottom,  M.D.    DW/MEDQ  D:  08/04/2002  T:  08/04/2002  Job:  [id number]

## 2011-01-01 NOTE — Discharge Summary (Signed)
Destiny [name], Destiny [name]                            ACCOUNT NO.:  [id number]   MEDICAL RECORD NO.:  [id number]                   PATIENT TYPE:  IPS   LOCATION:  0406                                 [facility]   PHYSICIAN:  Jeanice Lim, M.D.              DATE OF BIRTH:  11/24/1917   DATE OF ADMISSION:  12/11/2003  DATE OF DISCHARGE:  12/24/2003                                 DISCHARGE SUMMARY   IDENTIFYING DATA:  This is a 77 year old Caucasian widowed female,  voluntarily admitted, presenting with a history of increased anxiety and  depression, unable to identify any stressors, had been compliant with  medications, feeling tormented, feeling that she was burning.  She was  tearful, had not been eating.  The patient had been at [facility] 3 weeks ago for problems with nerves, as per patient.   ADMISSION MEDICATIONS:  Antivert, Ritalin, Zyprexa, Ditropan, trazodone and  Zoloft.   ALLERGIES:  No known drug allergies.   PHYSICAL EXAMINATION:  Within  normal limits, neurologically nonfocal.   ROUTINE ADMISSION LABS:  Within normal limits.   MENTAL STATUS EXAM:  Alert, elderly female, cooperative, with good eye  contact.  Speech clear, mood sad, affect restricted.  Thought process  somewhat goal directed although perseverating on degree of anxiety, feeling  all torn up, feeling that she was falling apart on the inside, felt that she  could not take it, could not stand it, would not be able to live through  this, describing panic as the worse symptom she was experiencing, along with  significant generalized anxiety and depressive symptoms.  She was  cognitively intact.  Judgment and insight fair.   INCOMPLETE DICTATION                                               Jeanice Lim, M.D.    JEM/MEDQ  D:  01/26/2004  T:  01/26/2004  Job:  [id number]

## 2011-01-01 NOTE — Discharge Summary (Signed)
Destiny [name], Destiny [name]                            ACCOUNT NO.:  [id number]   MEDICAL RECORD NO.:  [id number]                   PATIENT TYPE:  IPS   LOCATION:  0404                                 FACILITY:  [facility]   PHYSICIAN:  Jeanice Lim, M.D.              DATE OF BIRTH:  03-02-1918   DATE OF ADMISSION:  11/13/2003  DATE OF DISCHARGE:  11/19/2003                                 DISCHARGE SUMMARY   IDENTIFYING DATA:  This is an 77 year old Caucasian female who presented to  the emergency room from primary care physician referral for a nervous  breakdown.  Reported patient had been anxious, agitated and this was  getting worse.  Was unable to care for herself, not eating consistently.  The patient had been followed up by [facility] with Dr. Ilsa Iha.  First  hospitalization.   MEDICATIONS:  Meclizine, Ritalin, Baclofen, Zoloft and Ditropan.   ALLERGIES:  No known drug allergies reported.   PHYSICAL EXAMINATION:  Within normal limits.  Neurologically nonfocal.   LABORATORY DATA:  Routine admission labs within normal limits.   MENTAL STATUS EXAM:  Fully alert, reclusive, withdrawn, but cooperative,  pleasant.  Speech normal.  Mood depressed.  Thought processes positive  suicidal ideation, passive.  Cognitively intact.  Judgment and insight fair  to poor with very few coping skills.  Unable to cope with anxiety or mood.   ADMISSION DIAGNOSES:   AXIS I:  1. Major depressive disorder, recurrent, severe.  2. Possible generalized anxiety disorder.   AXIS II:  Deferred.   AXIS III:  1. Hypokalemia.  2. Status post urinary tract infection.  3. Vertigo.   AXIS IV:  Moderate (stressors related to support system).   AXIS V:  30/60.   HOSPITAL COURSE:  The patient was admitted and ordered routine p.r.n.  medications and underwent further monitoring.  Was encouraged to participate  in individual, group and milieu therapy.  Klonopin was added and patient was  monitored for  medical reversible etiologies of her psychopathology.  The  patient apparently had a gun at home and this was secured by a nephew.  The  patient was monitored and underwent stabilization of medications.  The  patient initially complained of severe anxiety, panic, feeling that she was  falling apart on the inside.  Had a history of doing very well and remaining  out of the hospital for some time on Klonopin, however.  Medications had  been changed with a change of psychiatrist.  The patient described being  anxious and depressed and these symptoms gradually improved as she was  stabilized with Klonopin being restarted and medications being optimized.  She reported no longer wanting to die.  Felt panic was under control,  feeling calm.  Mood was mostly euthymic.  Affect full.  She appeared to have  improved judgment and insight.  She reported motivation to be compliant with  the  aftercare plan, taking medications as directed and was discharged to  follow up with Dr. Ilsa Iha after medication education.   DISCHARGE MEDICATIONS:  1. Klonopin 0.5 mg, 1/2 q.a.m., 1/2 at 4 p.m., 1/2 q.h.s.  2. Ativan 0.5 mg, 1/2-1 per day as needed for severe panic attacks.  3. Vistaril 25 mg q.6h. p.r.n. anxiety.  4. Ritalin 5 mg at 7 a.m. as previously started by Dr. Ilsa Iha.  5. Antivert 25 mg b.i.d.  6. Ditropan 5 mg b.i.d.  7. Zoloft 100 mg, 2 q.a.m.  8. Relafen 500 mg b.i.d.  9. Zyprexa 2.5 mg q.h.s.   FOLLOW UP:  The patient was to follow up with Dr. Ilsa Iha on December 02, 2003  with [facility].   DISCHARGE DIAGNOSES:   AXIS I:  1. Major depressive disorder, recurrent, severe.  2. Possible generalized anxiety disorder.   AXIS II:  Deferred.   AXIS III:  1. Hypokalemia.  2. Status post urinary tract infection.  3. Vertigo.   AXIS IV:  Moderate (stressors related to support system).   AXIS V:  Global Assessment of Functioning on discharge 55.                                                Jeanice Lim, M.D.    JEM/MEDQ  D:  01/01/2004  T:  01/03/2004  Job:  [id number]

## 2011-01-01 NOTE — Consult Note (Signed)
[name], [name]                            ACCOUNT NO.:  [id number]   MEDICAL RECORD NO.:  [id number]                   PATIENT TYPE:  OUT   LOCATION:  XRAY                                 FACILITY:  [facility]   PHYSICIAN:  Karlene Einstein, M.D.             DATE OF BIRTH:  November 19, 1917   DATE OF CONSULTATION:  12/22/2003  DATE OF DISCHARGE:                                   CONSULTATION   REQUESTING PHYSICIAN:  Jeanice Lim, M.D.   REASON FOR CONSULTATION:  Productive cough.   HISTORY OF PRESENT ILLNESS:  This 77 year old Caucasian female who is a  resident at [name] is being consulted for a  productive cough.  Per patient, she has a chronic cough which appears to be  worse after being admitted here.  She says she brings up some phlegm and  notices blood in it.  She denies shortness of breath, chest pain, fever,  chills, or night sweats.  No other complaints.   REVIEW OF SYSTEMS:  CONSTITUTIONAL:  No fever, chills, or night sweats.  EYES:  No blurry vision, diplopia, or dry eye.  ENT:  No pain, tinnitus, or  ataxia.  RESPIRATORY:  As in HPI.  CARDIOVASCULAR:  No palpitations, edema.  or dyspnea.  GI:  No abdominal pain, nausea, vomiting, or hematochezia.  GU:  No dysuria, hematuria, or increased frequency.  MUSCULOSKELETAL:  No  __________  SKIN:  No rash, pruritus, or purpura.  NEUROLOGIC:  No weakness,  numbness, or tingling.   PAST MEDICAL HISTORY:  1. Partial small-bowel obstruction.  2. Diverticulitis.  3. Type 2 diabetes mellitus, diet controlled.  4. History of breast cancer.  5. Chronic cough.  6. Depression and anxiety.   PAST SURGICAL HISTORY:  1. Hysterectomy.  2. Appendectomy.   ALLERGIES:  no known drug allergies.   CURRENT MEDICATIONS:  1. Ditropan 5 mg b.i.d.  2. Ativan 0.75 mg q.h.s.  3. Zoloft 100 mg daily.  4. Zyprexa 12.5 mg daily.  5. Meclizine 25 mg daily.   PHYSICAL EXAMINATION:  VITAL SIGNS:  Temperature 98.4,  pulse 78, respiratory  rate 20, blood pressure 175/93.  GENERAL:  No acute distress.  Cooperative and pleasant.  HEENT:  Eyes: Pupils equal, round, and reactive to light.  Extraocular  muscles intact.  Oropharynx moist and clear.  NECK:  Supple.  No JVD or thyromegaly.  No cervical lymphadenopathy.  LUNGS: Decreased breath sounds, bilateral diffuse rhonchi.  HEART:  Regular rate and rhythm.  Normal S1 and S2.  No murmur, gallop, or  rub.  ABDOMEN:  Soft, nontender, nondistended.  CIRCULATION:  +2 pulses, no edema.  SKIN:  No rash, purpura, petechiae.  MUSCULOSKELETAL:  Full range of motion, no tenderness or deformity.  NEUROLOGIC:  Alert and oriented x 3.  No focal deficits.   LABORATORY AND X-RAY DATA:  Urinalysis on Dec 20, 2003, negative.  Potassium  3.4, sodium 141, BUN 13, creatinine 0.9.  LFTs normal.  White count 5.7,  hemoglobin 11.3, MCV 89, platelets 183.   Chest x-ray on April 29 showed chronic bronchitis.   IMPRESSION AND PLAN:  1. Productive cough.  She has a history of chronic cough per medical records     and also according to her.  She was also noted to have chronic bronchitis     on chest x-ray.  I will recheck another PA and lateral chest x-ray.  She     was supposedly treated by an antibiotic last week; therefore, I will not     use an antibiotic at this point.  Will change the guaifenesin to q.4h.     schedule.  2. Hypokalemia.  Will recheck and replete.  3. Normocytic anemia.  Recheck hemoglobin.  Check anemia studies.  4. Type 2 diabetes mellitus, diet controlled.  Check hemoglobin A1C.  5. Elevated blood pressure.  She has no history of hypertension.  Follow     closely.   Dr. Jamie Brookes will follow up from tomorrow.                                               Karlene Einstein, M.D.    GD/MEDQ  D:  12/22/2003  T:  12/22/2003  Job:  [id number]   cc:   Jeanice Lim, M.D.  Fax: [phone]

## 2011-01-01 NOTE — Consult Note (Signed)
Destiny [name], Destiny [name]                            ACCOUNT NO.:  [id number]   MEDICAL RECORD NO.:  [id number]                   PATIENT TYPE:  INP   LOCATION:  A313                                 FACILITY:  [facility]   PHYSICIAN:  Barbaraann Barthel, M.D.              DATE OF BIRTH:  September 14, 1917   DATE OF CONSULTATION:  08/04/2002  DATE OF DISCHARGE:                                   CONSULTATION   Surgery was asked to see this 77 year old white female, well known to me in  the past.   CHIEF COMPLAINT:  Lower abdominal pain and nausea.   HISTORY OF PRESENT ILLNESS:  The patient states that she has had lower  abdominal crampy pain for weeks.  This has been unaccompanied with any  vomiting.  However, she has had nausea.  She has had no diarrhea or black  tarry stools or bright red rectal bleeding.  She had a bowel movement just  prior to her coming to the emergency room.  However, the crampy abdominal  pain necessitated her coming to the emergency room for evaluation.  She was  admitted to Dr. Alonza Smoker service by the hospitalist, and surgery was  consulted.   PHYSICAL EXAMINATION:  GENERAL:  Anxious-appearing 77 year old white female  in no acute distress.  VITAL SIGNS:  Temperature 97.7, pulse rate 89 per minute, blood pressure  178/78, respirations 20.  HEENT:  Head is normocephalic.  Eyes:  Extraocular movements are intact.  Pupils react to light and accommodation.  She has had bilateral cataract  surgery.  Nose:  Oral mucosa is dry.  The patient uses dental prosthesis.  NECK:  Without jugular vein distention or thyromegaly or tracheal deviation  or cervical adenopathy.  CHEST:  Clear both anterior and posterior auscultation.  BREASTS:  She has had a previous right modified radical mastectomy.  There  is no clinical evidence of any recurrence on the right side.  In the left  breast I do not palpate any masses or axillary adenopathy.  ABDOMEN:  Bowel sounds are present.  They are not  increased, but they are  present.  Tenderness is noted primarily on the left side.  There are no  femoral or inguinal hernias appreciated.  The patient has multiple incisions  on her abdomen.  I do not appreciate any incisional hernia.  RECTAL:  Guaiac-negative stool.  EXTREMITIES:  There are no clubbing, cyanosis, or varicosities appreciated.  The pulses are full and symmetrical.   REVIEW OF SYSTEMS:  GASTROINTESTINAL:  The patient has had crampy abdominal  pain with nausea, no vomiting.  No unexplained weight loss.  No past history  of hepatitis.  She has had multiple episodes of partial small-bowel  obstructions in the past.  I saw her in 1997 and did a small-bowel  __________  .  This was complicated at that time with a wound infection.  However, since then  she has healed.  Recently, within the last year or so,  she has had an incisional herniorrhaphy performed in [place].  Other  abdominal surgeries have included an appendectomy and a TAH/BSO.  As stated,  she has had no bright red rectal bleeding or black tarry stools.  She has a  history of GERD, and she has been seen by the GI service in the past for  this.  GENITOURINARY:  She has a Foley catheter placed at present.  She,  prior to this, has had no complaints of dysoria prior to this, and no past  history of nephrolithiasis that I am aware of.  CARDIORESPIRATORY:  The  patient has a history of chronic obstructive pulmonary disease, chronic  bronchitis, and a chronic cough.  She is a nonsmoker and a nondrinker.  I do  hear a 1-2 systolic murmur appreciated.  ENDOCRINE:  The patient is a type 2  diabetic.  She has no past history of thyroid disease.  MUSCULOSKELETAL:  The patient is obese, and she has some chronic articular discomfort.  No  past history of any orthopedic procedures.  NEUROLOGIC:  The patient has a  history of depression and anxiety.   LABORATORY DATA:  The patient had an acute abdominal series done last night   which showed some very minimally prominent small-bowel loops.  These  increased when they were repeated this morning, and an NG tube was placed.   White count was noted at 10,000, with a normal differential.  MET-7 showed  the patient to have hypokalemia.  Her white count was 10.1 with an H&H of  12.0 and 36.1 with 70 neutrophils noted.  Her sodium was 142 with a  potassium of 2.8, chloride 100, CO2 of 33, glucose of 152, BUN of 26, and a  creatinine of 1.3.  Total bilirubin 0.4, direct less than 0.1.  The patient  also had alkaline phosphatase, and the rest of her liver function studies  all are within normal limits as is her lipase and amylase.  She had some  cardiac markers with a creatine kinase of 218, a CK-MB of 4.5, relative  index 2.1, and troponin levels of 0.01.  This was evaluated by the medical  service and not deemed significant in the emergency room last night.   IMPRESSION:  This patient presents, I believe, with a recurrent partial  small-bowel obstruction.  She has had these problems in the past.  I think  this is also made worse by her hypokalemia.  These are likely secondary to  her multiple intra-abdominal adhesions.   She also has a history of chronic obstructive pulmonary disease and chronic  bronchitis and depression and type 2 diabetes and a history of  gastroesophageal reflux disease.   PLAN:  I would admit her and hydrate and treat her as if she had  diverticulitis as I notice clinically somewhat more tenderness on the left  lower quadrant.  Levaquin has been selected, and I agree with that as a  choice.  Otherwise, will follow her with IV hydration and NG suction at this  point, and I will follow her closely for any acute surgical changes.  I have  communicated this to Dr. Ouida Sills.   Incidentally, there was mentioned the widened mediastinum; however, this has  not been substantiated on other films and is due to the film technique and is not a real  finding.  Barbaraann Barthel, M.D.    WB/MEDQ  D:  08/04/2002  T:  08/05/2002  Job:  [id number]   cc:   Kingsley Callander. Ouida Sills, M.D.  [address]  [place]  [place] [address]  Fax: [phone]

## 2011-01-01 NOTE — Procedures (Signed)
   NAMEKILIE, RUND                            ACCOUNT NO.:  [id number]   MEDICAL RECORD NO.:  [id number]                   PATIENT TYPE:  INP   LOCATION:  A313                                 FACILITY:  [facility]   PHYSICIAN:  Edward L. Juanetta Gosling, M.D.             DATE OF BIRTH:  06-12-1918   DATE OF PROCEDURE:  DATE OF DISCHARGE:                                EKG INTERPRETATION   TIME AND DATE:  2336 on 08/03/02   INTERPRETATION:  The rhythm is sinus rhythm with a rate in about 90.  There  are T waves inferiorly suggestive of previously inferior MI.  The axis is  leftward, but does not meet criteria for left axis deviation.  There is very  little voltage laterally in the limb leads, or in the chest leads,  suggestive of previous lateral myocardial infarction as well.   IMPRESSION:  Abnormal electrocardiogram.                                               Edward L. Juanetta Gosling, M.D.    ELH/MEDQ  D:  08/06/2002  T:  08/07/2002  Job:  [id number]

## 2011-01-01 NOTE — Op Note (Signed)
[place]  Patient:    Destiny [name]                          MRN: [id number] Proc. Date: 09/14/99 Adm. Date:  [id number] Attending:  Katha Cabal Dictator:   [place] Daphine Deutscher, M.D. CC:         Shanna Cisco, MD                           Operative Report  PREOPERATIVE DIAGNOSIS:  Giant ventral hernia.  POSTOPERATIVE DIAGNOSIS:  Giant ventral hernia.  SURGEON:  Dr. Daphine Deutscher.  ASSISTANT:  Catalina Lunger, M.D.  ANESTHESIA:  General endotracheal anesthesia.  DESCRIPTION OF PROCEDURE:  [name] was taken to the operating room #16 after  receiving outpatient bowel prep.  Preoperatively, informed consent was obtained  regarding her increased risk of this operation.  She was desperate, in that she has been miserable with this ventral hernia and has had some episodes of obstruction. Preoperative labs which had been obtained five days prior to surgery showed an elevated white count.  This was repeated today, and this had come down almost in the normal range.  In addition, I checked a chest x-ray which was within normal  limits.  She was informed of this, and we discussed proceeding with surgery, and she wanted to go ahead and get this taken care of.  After being prepped and draped, her old incision was excised.  I used a knife and a Bovie to get down into the vicinity of the hernia sac.  From her previous surgery it appeared that she probably had a fascial dehissence with bowel up into the wound.  It subsequently healed and was adherent into this process.  There was no just defined hernia sac that was separate from the bowel that laid within it.  was able to enter one area of the abdomen, and then gradual create a space. This took some time.  One knuckle of bowel was twisted at the umbilicus and double-backed on itself up almost to the skin.  I did enter it here and closed his entry wound.  I subsequently went back and  resected a small segment of that bowel, and used the stapler to create a functional end-to-end anastomosis, and closed he common defect and resected that small little segment that I felt was devascularized.  I worked my way around the wall and had one other small enterotomy which I again closed.  This one with interrupted simple 3-0 silk sutures.  This was checked subsequently prior to closure, and was intact with good blood supply.  After about 2-1/2 hours of dissection, I had the fascial ring, which was quite extensive, mobilized.  I did not want to try to put mesh in, particularly with er past history, her age, and the enterotomy.  I therefore went back and created some relaxing incisions in the anterior abdominal wall.  This allowed the midline structures to be closed using a running #1 Prolene which I had used along with interrupted simple #1 Novofil periodically.  This seemed to proximate the midline fascia with really no tension.  I then irrigated the wound using double antibiotic solution, as well as saline.  Two Jackson-Pratt drains were placed from either ide and laid in on the fascia.  The skin was then closed with vertical mattress sutures of 3-0 Nylon and staples.  The patient seemed to tolerate the procedure well. he will be taken to the intensive care unit postoperatively for monitoring.  FINAL DIAGNOSIS:  Giant ventral incisional hernia.  PROCEDURE:  Exploration with lysis of massive amount of adhesions, repair of enterotomy with small enterectomy and a primary anastomosis, myofascial advancement flaps with primary closure of the abdomen.DD:  09/14/99 TD:  09/15/99 Job: [id number]

## 2011-01-01 NOTE — Op Note (Signed)
NAMECAMARI, Destiny [name]                  ACCOUNT NO.:  [id number]   MEDICAL RECORD NO.:  [id number]          PATIENT TYPE:  AMB   LOCATION:  DAY                           FACILITY:  [facility]   PHYSICIAN:  R. Roetta Sessions, M.D. DATE OF BIRTH:  1917-12-21   DATE OF PROCEDURE:  11/18/2004  DATE OF DISCHARGE:                                 OPERATIVE REPORT   PROCEDURE PERFORMED:  Colonoscopy with biopsy and snare polypectomy.   INDICATIONS FOR PROCEDURE:  Patient is an 77 year old lady who underwent a  colonoscopy back in 2000, had multiple tubulovillous adenomas removed from  her colon.  She returns for surveillance now.  Colonoscopy has been  discussed with the patient at length.  Potential risks, benefits and  alternatives have been reviewed and questions answered.  She is agreeable to  proceed.  Please see documentation in the medical records.   PROCEDURE NOTE:  Oxygen saturations, blood pressure, pulse and respirations  were monitored throughout the entire procedure.  Conscious sedation with  Versed 4 mg IV, Demerol 75 mg IV in divided doses.   INSTRUMENT USED:  Olympus video chip system pediatric colonoscope.   FINDINGS:  Digital rectal exam revealed no abnormalities.   Endoscopic findings:  The prep was marginal, quite a bit of liquid viscous  stool trapped, which made the exam more difficult.  Rectum:  Examination of the rectal mucosa including retroflex view of the  anal verge revealed only internal hemorrhoids.  Colon:  Colonic mucosa surveyed from the rectosigmoid junction through the  left, transverse and right colon to the area of appendiceal orifice,  ileocecal valve and cecum.  These structures were well seen and photographed  for the record.  From this level, scope slowly withdrawn.  All previously  seen mucosal surfaces were again seen.  The following abnormalities were  noted.   (1)  The patient had pan colonic diverticula.  (2)  At the ileocecal valve there was a 1 x 1  cm area of somewhat purplish  discoloration, not particularly vascular in appearance; however, would have  an unusual appearance for a flat adenoma.  I elected to go ahead and biopsy  it.  This was done without difficulty or excessive bleeding.  There was an  adjacent diminutive polyp which was cold biopsied/removed.  There was a 0.75  cm polyp on a stalk at mid descending colon which was removed and recovered  with snare cautery.  There was a large flat carpet-like polyp at 35 cm  straddling a fold, dimensions approximately 1 x 2 cm.  It was quite subtle  but present.  I lifted the polyp away from the flat wall with 4 cc of  sterile saline.  This was done without difficulty; however, it still  remained quite flat in appearance. I approached it with a snare, was able to  pretty much piecemeal remove it totally.  There were a couple of areas of  peripheral oozing at the polypectomy crater for which I elected to put in 6  cc of 1:10,000 epinephrine  which made a nice cushion.  Please see photos.   Remainder of colonic mucosa appeared normal although prep made the exam more  difficult.  The patient tolerated the procedure well and was reacted in  endoscopy.   IMPRESSION:  1.  Internal hemorrhoids.  Otherwise normal rectum.  2.  Pancolonic diverticula.  3.  Multiple colonic polyps, diminutive polyp at the ileocecal valve cold      biopsy/removed.  Polyp at mid descending removed with snare.  Flat      carpet like poly removed in piecemeal fashion (saline and epinephrine      assisted) at 35 cm.  4.  Some subtle discoloration of ileocecal valve and a localized area of      uncertain significance biopsied separately.   RECOMMENDATIONS:  1.  No aspirin or arthritis medications for the next 10 days.  2.  Follow-up on path.  3.  Further recommendations to follow depending on path.  This lady will      need an early follow-up colonoscopy, given the marginal prep today.      RMR/MEDQ  D:   11/18/2004  T:  11/18/2004  Job:  [id number]   cc:   Kingsley Callander. Ouida Sills, MD  [address]  [place]  [place] [address]  Fax: [phone]

## 2011-01-01 NOTE — Discharge Summary (Signed)
NAMEKERRY-ANNE, Destiny [name]                  ACCOUNT NO.:  [id number]   MEDICAL RECORD NO.:  [id number]          PATIENT TYPE:  IPS   LOCATION:  0501                          FACILITY:  [facility]   PHYSICIAN:  Anselm Jungling, MD  DATE OF BIRTH:  Sep 09, 1917   DATE OF ADMISSION:  09/12/2005  DATE OF DISCHARGE:  09/20/2005                                 DISCHARGE SUMMARY   IDENTIFYING DATA AND REASON FOR ADMISSION:  The patient is an 77 year old  widowed white female, a patient of Dr. Lolly Mustache, psychiatrist in our  outpatient section. She was admitted for exacerbation of severe,  longstanding depression. She had had increased crying, decreased sleep and  appetite. She came to [place] on a regimen that included Wellbutrin, Zyprexa,  Zoloft, Aricept, and Ativan. The patient was not suicidal at the time of  admission, but did definitely want help. Please refer to the admission note  for further details pertaining to the symptoms, circumstances and history  that led to her hospitalization. She was given an initial Axis I diagnosis  of major depressive disorder, recurrent without psychotic features.   MEDICAL AND LABORATORY:  The patient was physically assessed by the  psychiatric nurse practitioner upon admission.   LABORATORY STUDIES:  The patient's symptoms suggested a urinary tract  infection, and this was treated with a course of Ciprofloxacin 500 mg b.i.d.  times 7 days. Her symptoms cleared up uneventfully. There were no other  significant medical issues.   HOSPITAL COURSE:  The patient was admitted to the adult inpatient  psychiatric service. She presented as a very pleasant, well organized,  articulate elderly woman who was very sad, depressed, and easily brought to  tears. She was very much wanting help.   It was the assessment of the undersigned and the nurse practitioner that  some of the patient's mood disturbance may have been related to her  underlying urinary tract infection and its  attendant metabolic stresses. As  such, we made a decision not to make any significant changes in her  psychotropic medication regimen. She was continued on a regimen, and  discharged on a final regimen of Zoloft 150 mg daily, Wellbutrin XL 300 mg  daily, Zyprexa 5 mg daily, Aricept 10 mg daily, and Ativan 0.5 mg daily.   AFTERCARE:  The patient was discharged on the ninth hospital day. By this  time her mood was significantly improved. We discussed with her the  possibility of electing to go to an assisted-living facility for more  support and socialization opportunities, but she was adamantly opposed to  this, instead wanting to return home to her home, her roommate, and her dog.   She was to follow-up with Dr. Lolly Mustache for medication management shortly after  discharge. She was to follow-up for individual therapy with Dr. Ricci Barker,  in the [place] clinic on February 6, the day following discharge.   DISCHARGE DIAGNOSES:  AXIS I: Major depressive disorder recurrent, without  psychotic features.  AXIS II: Deferred.  AXIS III: Status post urinary tract infection.  AXIS IV: Stressors severe.  AXIS V: GAF  on discharge 65.           ______________________________  Anselm Jungling, MD  Electronically Signed     SPB/MEDQ  D:  09/21/2005  T:  09/21/2005  Job:  [id number]

## 2011-01-01 NOTE — Discharge Summary (Signed)
[place]. [facility]  Patient:    Destiny, [name]                         MRN: [id number] Adm. Date:  [id number] Disc. Date: 09/25/99 Attending:  Katha Cabal CC:         John A. Ouida Sills, M.D., [place], [place]             [place]. Daphine Deutscher, M.D.                           Discharge Summary  CHIEF COMPLAINT:  Giant ventral hernia.  HISTORY:  Destiny [name] is a 77 year old white female who underwent laparotomy for  bowel obstruction several years ago, complicated by reoperation and apparent dehiscence with secondary wound healing.  She developed a very large ventral hernia, which was bulging considerably and probably led to her admission at [facility] in December 2000.  She has been very upset now with this, not only from its size but also from this partial bowel obstruction.  She was seen in the office and an informed consent was obtained regarding her significant risks because of other underlying medical problems (COPD).  She was  admitted at this time for this repair.  PAST MEDICAL HISTORY:  Significant in addition to her ventral hernia and COPD, he has depression and anxiety; losing her husband in the last year.  She also had er right breast removed for breast cancer many years ago.  She has had problems with gastroesophageal reflux disease.  She had pneumococcal pneumonia over the past year.  She has had a previous hysterectomy and appendectomy, and the above-mentioned small bowel obstruction.  She has had T&A and cataract surgery.  MEDICATIONS: 1. Ventolin and Atrovent nebulizer treatments. 2. Accolade 20 mg b.i.d. 3. Celexa 60 mg q.d. 4. Prilosec 20 mg q.d. 5. Claritin 10 mg q.d. 6. Premarin 0.625 q.d. 7. Humabid LA two p.o. b.i.d.  ALLERGIES:  Denies any allergies.  SOCIAL HISTORY:  Former smoker.  Does not drink alcohol.  She lives at home alone in [place].  PHYSICAL EXAMINATION:  GENERAL:  Reveals an elderly  white female.  HEENT:  No scleral icterus.  NECK:  Supple without adenopathy.  LUNGS:  Bilateral faint rhonchi.  HEART:  Reveals sinus rhythm with no murmurs or gallops.  ABDOMEN:  When she stood up abdomen protruded markedly in the upper midline, and pushed it off to the side.  This could be reduced and appeared to contain bowel. No inguinal hernias were noted.  EXTREMITIES:  No clubbing, cyanosis, or edema.  ACCESSORY CLINICAL FINDINGS:  Chest x-ray with no evidence of active disease within the chest.  Admitting laboratory included initially on September 10, 1999:  White  count 15,100, which by September 14, 1999 was 11,700; with a hemoglobin of 13.2. Her most recent examination on September 24, 1999 showed:  White count 9,600 with a hemoglobin of 10.1.  Electrolytes were within normal limits.  Cholesterol 144.  Triglycerides 81.  The rest of her CM was all within normal limits.  Sputum examination was done on September 16, 1999, which did not show any isolates that were suggestive of pneumonia.  PROCEDURES:  September 14, 1999 -- Takedown of complex abdominal wall hernia, small bowel enterectomy, closure with myofascial mobilization and primary closure.  HOSPITAL COURSE:  Destiny [name] had a bowel prep as an outpatient and was brought  n on September 14, 1999 and underwent the above-mentioned operation.  She had numerous loops of bowel that were densely adherent within the subcutaneous spaces. There was not a clear-cut hernia sac, as it was all seen to be fused with the overlying tissue.  She underwent the operation mentioned above, and was taken to the ICU postoperatively.  She had two Jackson-Pratt drains to drain the large extrafascial space that was created in this dissection.  These were kept in for about one week and just gradually drained less and less, finally just serosanguineous and serous material before being removed.  She had aggressive pulmonary toilet used  during in the intensive care unit to prevent any complications, and was seen by a pulmonary CCM staff to aid in that  effort.  Radiology placed a PICC line, and we began her on TNA to supplement her. This was maintained until near the time of her discharge.  She was begun on water on the fourth postoperative day, transferred to 5700 where she made a reasonably uneventful recovery.  She was begun on full liquids by postoperative day #7, and was then taking a regular diet.  Her Jackson-Pratt drains were removed on September 22, 1999.  We made arrangements for discharge planning for her to go home on Friday, September 25, 1999.  Her staples were removed at that time.  She still had some old skin stitches t  o approximate the skin edges, which we will leave in.  We plan to see her back in the office in one week.  FINAL DIAGNOSES: 1. Status post repair of complex ventral hernia, with primary repair using    myofascial advancement. 2. Severe COPD. 3. Anxiety and depression.DD:  09/25/99 TD:  09/25/99 Job: [id number]

## 2011-01-01 NOTE — H&P (Signed)
Destiny [name], Destiny [name]                  ACCOUNT NO.:  [id number]   MEDICAL RECORD NO.:  [id number]          PATIENT TYPE:  INP   LOCATION:  A339                          FACILITY:  [facility]   PHYSICIAN:  Kingsley Callander. Ouida Sills, MD       DATE OF BIRTH:  [birth year]   DATE OF ADMISSION:  06/29/2006  DATE OF DISCHARGE:  LH                                HISTORY & PHYSICAL   HISTORY OF PRESENT ILLNESS:  This patient is an 77 year old white female who  presented to the emergency room after a fall yesterday.  She had gone out to  her mailbox to retrieve her mail and fell.  She suffered a fracture of her  right hip.  She was hospitalized and placed in Buck's traction.  Surgery is  scheduled for tomorrow.  A medical consultation was requested.  She had  generally been in a stable state of health medically.  She has a long-  standing history of psychiatric disease, primarily depression which has been  managed most recently by Dr. Lolly Mustache.  She was last hospitalized for this  last winter.   PAST MEDICAL HISTORY:  1. Right breast cancer, status post mastectomy in 1976.  2. Diabetes.  3. Depression.  4. B-12 deficiency, on monthly replacements.  5. Spinal stenosis.  6. Colon polyps.  7. Chronic cough.  8. Hysterectomy.  9. Tonsillectomy.  10.Cataract surgery.  11.Appendectomy.  12.Abdominal wall hernia repair.  13.Right chest surgery.  14.Diverticulitis.  15.Memory loss.   MEDICATIONS:  1. Wellbutrin XL 300 mg daily.  2. Zyprexa q.h.s.  3. Vitamin B-12 injections monthly.  4. Aricept 10 mg daily.   ALLERGIES:  None.   FAMILY HISTORY:  Multiple siblings have had diabetes.  Her father had a  cancer of an unknown origin.  Her mother died of an unknown cause at age 18.   SOCIAL HISTORY:  She is retired from [employer].  She has not smoked  for over 35 years.  She does not smoke, drink or use recreational drugs.   REVIEW OF SYSTEMS:  No chest pain, shortness of breath, breast lumps, change  in  bowel habits, rectal bleeding or difficulty voiding.   PHYSICAL EXAMINATION:  VITAL SIGNS:  Temperature 98.3, pulse 83,  respirations 20, blood pressure 127/60.  Fasting glucose 129.  GENERAL:  Alert and oriented.  HEENT:  Eyes and pharynx were unremarkable.  NECK:  No JVD or thyromegaly.  LUNGS:  Clear.  HEART:  Regular with a grade 1 systolic murmur.  CHEST:  Status post right mastectomy.  ABDOMEN:  Noncontributory.  No hepatosplenomegaly.  EXTREMITIES:  She has normal palpable pulses in both feet with no peripheral  edema.  Her right leg is in Buck's traction.  A Foley catheter is in place.  NEUROLOGIC:  At baseline.  LYMPH NODES:  No enlargement.  SKIN:  Intact.   LABORATORY DATA:  White count 9.8, hemoglobin 12.1, platelets 201,000.  Sodium 142, potassium 4.2, glucose 104, BUN 19, creatinine 1.2, calcium 9.0.  The chest x-ray reveals a questionable right upper lobe mass.  A right hip x-  ray reveals a minimally displaced subcapital fracture of the right femoral  neck.   IMPRESSION:  1. Right hip fracture.  There appeared to be no absolute contraindications      from a medical or psychiatric standpoint to upcoming hip surgery.  Will      follow with you.  2. Questionable right upper lobe mass.  Will obtain a CT scan of the chest      at a later date.  3. History of glucose intolerance.  Will follow.  4. Depression.  Continue current therapy.  5. History of breast cancer.  6. History of B-12 deficiency.  7. Spinal stenosis.      Kingsley Callander. Ouida Sills, MD  Electronically Signed     ROF/MEDQ  D:  06/30/2006  T:  07/01/2006  Job:  [id number]   cc:   Vickki Hearing, M.D.  Fax: [phone]

## 2011-01-01 NOTE — Discharge Summary (Signed)
Destiny [name], Destiny [name]                            ACCOUNT NO.:  [id number]   MEDICAL RECORD NO.:  [id number]                   PATIENT TYPE:  INP   LOCATION:  A313                                 FACILITY:  [facility]   PHYSICIAN:  Kingsley Callander. Ouida Sills, M.D.                  DATE OF BIRTH:  1918-01-21   DATE OF ADMISSION:  08/04/2002  DATE OF DISCHARGE:  08/07/2002                                 DISCHARGE SUMMARY   DISCHARGE DIAGNOSES:  1. Partial small-bowel obstruction.  2. Possible low-grade diverticulitis.  3. Hypokalemia.  4. Type 2 diabetes.  5. Dehydration.  6. Urinary tract infection.  7. History of breast cancer.  8. Depression.  9. Anxiety.  10.      Chronic cough.   HOSPITAL COURSE:  This patient is an 77 year old white female who presented  to the emergency room with abdominal pain.  Her white count was 10.1.  She  was hypokalemic at 2.8.  She was dehydrated with a BUN of 26 and a  creatinine of 1.3.  Her urinalysis revealed many bacteria with a positive  nitrite and 7 to 10 white cells.  X-rays were consistent with a partial  small-bowel obstruction.  She was hospitalized and treated with IV fluids  and NG suction and was seen surgically by Dr. Barbaraann Barthel.   Repeat abdominal films revealed improvement in her obstructive pattern.  Her  NG was removed.  She was able to tolerate clear liquids without difficulty.   Her potassium normalized with IV supplementation.   Her urinary tract infection was treated with Levaquin.  She was also felt to  possibly have diverticulitis with some localized left lower quadrant  discomfort, although her pain eventually resolved.   She had a large bowel movement on August 05, 2002 and felt much better  following that.  MiraLax was started.   Her dehydration resolved.  Her BUN and creatinine dropped to 13 and 0.9.   Her diabetes was reasonably controlled with a fasting glucose of 122; she  has been on non-drug therapy.   She was  continued on her usual psychiatric medications including Zoloft,  Remeron and Ativan; she is followed by Dr. Antonietta Breach in [place].   She was much improved and stable for discharge on August 07, 2002.  She  will follow up in the office in two weeks.   DISCHARGE MEDICATIONS:  1. Levaquin 500 mg every day for five days.  2. Remeron 30 mg every day.  3. Zoloft 200 mg every day.  4. MiraLax 17 g three times a week.  5. Ativan 1 mg t.i.d. p.r.n.  Kingsley Callander. Ouida Sills, M.D.    ROF/MEDQ  D:  08/07/2002  T:  08/07/2002  Job:  [id number]

## 2011-01-01 NOTE — Op Note (Signed)
Destiny [name]  Patient:    Destiny [name], Destiny [name]                       MRN: [id number] Proc. Date: 01/18/01 Adm. Date:  [id number] Attending:  Katha Cabal CC:         Dr. Ouida Sills, [place] Ace.  Barbaraann Share, M.D. [facility]   Operative Report  CCS NUMBER:  [id number]  PREOPERATIVE DIAGNOSIS:  Severe chronic bronchitis and adult asthma related to gastroesophageal reflux disease and hiatal hernia.  POSTOPERATIVE DIAGNOSIS:  Severe chronic bronchitis and adult asthma related to gastroesophageal reflux disease and hiatal hernia.  OPERATION:  Laparoscopic repair of large hiatal hernia with laparoscopic Nissen fundoplication.  SURGEON:  [place]. Daphine Deutscher, M.D.  ASSISTANTFelicity Pellegrini, M.D.  ANESTHESIA:  General endotracheal anesthesia.  DESCRIPTION OF PROCEDURE:  Destiny [name] is an 77 year old lady who had undergone repair of a huge ventral hernia by me in April of 2001.  She had done well from that ventral hernia repair despite its complexities but has developed severe bronchitis felt to be reflux related.  A swallow was obtained by me which showed her to have moderate size hiatal hernia with free reflux.  The patient was taken to Room 1 on June 5 and given general anesthesia.  The abdomen was prepped with Betadine and draped sterilely.  Because of her midline incision, I made the upper midline incision way above her umbilicus and above the previous incision, but instead of doing that initially, I went in the left upper quadrant and did a percutaneous cut down and inserted the Hasson cannula.  This was held in place with nylon sutures.  The abdomen was insufflated, and then I put the midline one in and took down a lot of adhesions in the midline so I could see the right side, and then put one in the right upper quadrant.  These were all 10-11s and, therefore, I was left with a 10-11 in the left upper quadrant, a 10-11 in the midline, a 5 in the upper  midline for the liver retractor, 10-11 in the right upper quadrant.  My limitations were the massive adhesions from her ventral hernia repair.  I took down some of those but left the rest.  I had plenty of room to work.  I did put the liver retractor in and initially had a little tear from the anterior surface of the left lateral segment, and I ended up having to take down the left lateral segment from the diaphragm, and this enabled me to retract that to expose the hiatal hernia.  I have taken a photograph of that which is in the chart that shows a moderate size defect in her hiatus with a sliding hernia.  The stomach was then pulled out of this, and this was not only an anterior herniation, but also there was posterior herniation.  I next used the harmonic scalpel and did a nice dissection, taking the peritoneal reflections down off the crura and then reducing this large fat mass that was surrounding the esophagus.  I did not see any evidence of nodes or any evidence of any malignant disease but did see a lot of fatty tissue that was pushing in on the distal esophagus probably contributing to its irregularity.  I was finally able to get beneath it and get around it with a Penrose drain, and this enabled me to skeletonize the distal esophagus and get it nicely  into the abdomen.  A 40 dilator was passed by Dr. Gladis [name], and with that in place, I then closed the hiatus using interrupted simple sutures using the EndoStitch.  Four such sutures were placed, and this closed the hiatal defect pretty much completely.  Then I went around behind the esophagus and was able to grasp a portion of the cardia, bring it around for the invagination of the distal esophagus with the stomach.  This was done, and the first stitch was tied down to the esophagus. I then went above that with another EndoStitch, and all of these were tied extracorporally because I really did not have a big enough compartment to  do intracorporeal ties.  However, I was able to place four sutures in the wrap, and this snugged this up nicely.  Clips were placed on the knots.  This again was photographed and is in the chart.  Port sites were inspected.  The one in her left upper quadrant, which was the one I did open, I closed using the Endoclose, and we placed two sutures of Vicryl in that.  The remaining ones were placed obliquely, and we observed those.  I did inject all the port sites with Marcaine and evacuated the clot.  There was no evidence of active bleeding.  There was no evidence of any untoward bowel injury.  The patient seemed to tolerate the procedure well and was taken to the recovery room in satisfactory condition. DD:  01/18/01 TD:  01/18/01 Job: [id number]

## 2011-01-01 NOTE — Group Therapy Note (Signed)
Destiny [name], Destiny [name]                  ACCOUNT NO.:  [id number]   MEDICAL RECORD NO.:  [id number]          PATIENT TYPE:  INP   LOCATION:  A339                          FACILITY:  [facility]   PHYSICIAN:  Vickki Hearing, M.D.DATE OF BIRTH:  [birth year]   DATE OF PROCEDURE:  DATE OF DISCHARGE:                                   PROGRESS NOTE   Status post internal fixation of right hip with cannulated screws.  The  patient is confused today.  Temperature is 98.6, blood pressure is 142/79,  O2 sat is 98%.  Hemoglobin is 10.4, sodium is 136, potassium is 3.5.  Other  than that and her slow progress in therapy, she is doing well.  The  confusion most likely secondary to environment change.  She is on only 1 mg  of morphine and is not taking much of that.  Medicine will assess her today.  We will continue with plans for therapy and, at this point, she seems like  she will need rehabilitation.      Vickki Hearing, M.D.  Electronically Signed     SEH/MEDQ  D:  07/03/2006  T:  07/03/2006  Job:  [id number]

## 2011-01-01 NOTE — Discharge Summary (Signed)
[place]. [facility]  Patient:    Destiny [name], Destiny [name]                         MRN: [id number] Adm. Date:  [id number] Disc. Date: [id number] Attending:  Katha Cabal                           Discharge Summary  ADMISSION DIAGNOSIS:  Acute pneumonia on top of acute and chronic bronchitis with ileus.  HISTORY:  Ms. [name] is a lady who was recently discharge from [facility] after repair of a giant ventral hernia on September 14, 1999.  She went home for a few days and then began having a lot of difficulty with breathing and fevers.  X-ray after admission was better than expected, really not showing an infiltrate, but she did have an elevated white count.  She was treated with antibiotics, and also urine cultures and sputum cultures were pending.  Urine cultures showed enterococcus and sputum cultures showed gram-positive cocci and gram-negative rods.  She was begun on Augmentin b.i.d. and was ready for discharge on October 06, 1999.  DISCHARGE DIAGNOSES: 1. Acute and chronic bronchitis with positive sputum cultures indicative of    pneumonia. 2. Urinary tract infection with enterococcus.  CONDITION ON DISCHARGE:  Improved. DD:  11/08/99 TD:  11/09/99 Job: [id number]

## 2011-01-01 NOTE — Procedures (Signed)
. [facility]  Patient:    Destiny [name], Destiny [name]                         MRN: [id number] Proc. Date: 11/07/00 Adm. Date:  [id number] Disc. Date: [id number] Attending:  Starr Sinclair CC:         Barbaraann Share, M.D. [facility]                           Procedure Report  PROCEDURE:  Esophageal manometry.  SURGEON:  Malcolm T. Pleas Koch.  INDICATION:  [age]-year-old white female with a severe chronic cough and chronic GERD.  Assessment for further management and possible Nissen fundoplication.  UPPER ESOPHAGEAL SPHINCTER STUDY:  The upper esophageal resting pressure was 22.6 mmHg, below the normal range of 30 to 118 mmHg.  Ninety-seven percent relaxation was noted and all other parameters appeared to be within normal ranges.  LOWER ESOPHAGEAL BODY STUDY:  Ten wet swallows were noted with 100% peristaltic activity.  Distal esophageal amplitude was 148 mmHg and the tracing appeared normal.  In the upper portion of the lower esophagus, five wet swallows were performed with 100% peristaltic activity.  Amplitude was 71 mmHg and this area of the study appeared to be normal.  LOWER ESOPHAGEAL SPHINCTER STUDY:  Lower esophageal sphincter resting pressure was 7.9 mmHg, with the normal range being 10 to 45 mmHg.  One hundred percent relaxation was noted and the remainder of the study appeared to be normal.  IMPRESSION: 1. Hypotensive lower esophageal sphincter with normal relaxation. 2. Hypotensive upper esophageal sphincter with normal relaxation. 3. Normal esophageal peristalsis with normal esophageal pressures.  RECOMMENDATIONS:  No contraindication to Nissen fundoplication. DD:  11/07/00 TD:  11/22/00 Job: [id number]

## 2011-01-01 NOTE — H&P (Signed)
[facility]  Patient:    Destiny [name], Destiny [name]                       MRN: [id number] Adm. Date:  [id number] Disc. Date: [id number] Attending:  Katha Cabal                         History and Physical  CHIEF COMPLAINT:  Severe loud cough related to gastroesophageal reflux and hiatal hernia.  HISTORY OF PRESENT ILLNESS:  The patient is an 77 year old lady on whom I repaired a huge ventral hernia about a year-and-a-half ago.  Successful repair of this hernia has aggravated her reflux and her large hiatal hernia.  She has been presenting to Dr. Shelle Iron with a lot of coughing and loud upper respiratory sounds.  She was worked up by Dr. Claudette Head, and had a manometry which showed a DeMeester score of 45 and marked _______.  She was seen and repair was discussed with her, and she was willing to take the increased risks that would accompany this surgery in her since she is an older lady.  MEDICATIONS:  Two b.i.d. Remeron, 45 h.s. Antivert.  ALLERGIES:  No known drug allergies.  PAST SURGICAL HISTORY:  Includes breast mastectomy in 1967, hysterectomy in 1968, and 1969 appendectomy, small bowel obstruction at [facility], and subsequent ventral hernia repair by me a year ago at [facility].  PHYSICAL EXAMINATION:  VITAL SIGNS:  Blood pressure in the 140/70 range, pulse 84, and afebrile.  HEENT EXAMINATION:  Unremarkable.  CHEST:  Breath sounds are equal bilaterally with some faint wheezes.  She had some mainly upper airway noise.  HEART:  Sinus rhythm.  ABDOMEN:  Ventral hernia repair successful.  No recurrent hernia noted.  EXTREMITIES:  Full range of motion.  IMPRESSION:  Hiatal hernia with gastroesophageal reflux disease.  PLAN:  Laparoscopic hiatal hernia repair. DD:  02/04/01 TD:  02/06/01 Job: [id number]

## 2011-01-01 NOTE — Discharge Summary (Signed)
NAMEAUBRII, SHARPLESS                  ACCOUNT NO.:  [id number]   MEDICAL RECORD NO.:  [id number]          PATIENT TYPE:  INP   LOCATION:  A339                          FACILITY:  [facility]   PHYSICIAN:  Vickki Hearing, M.D.DATE OF BIRTH:  Jan 06, 1918   DATE OF ADMISSION:  06/29/2006  DATE OF DISCHARGE:  11/21/2007LH                               DISCHARGE SUMMARY   ADMITTING DIAGNOSIS:  Right femoral neck fracture.   DISCHARGE DIAGNOSIS:  Right femoral neck fracture.   PROCEDURE:  Open treatment internal fixation right hip with 3 Synthes  cannulated screws.   SECONDARY DIAGNOSES:  1. Postoperative confusion.  2. Acute blood loss anemia from surgery.  3. Hypokalemia.   HOSPITAL COURSE:  The patient was admitted on the 14th, had a medical  consult, was cleared for surgery.  Then on the 16th, she had a drop in  her O2 saturation.  A workup was instituted including arterial blood  gas, CT scan of the chest, chest x-ray, EKG, cardiac enzymes; all  studies were normal.  The patient's O2 saturation improved, and on the  17th, she underwent surgery as stated.  In the postoperative period, the  patient became confused, actually got out of bed on one occasion and  fell.  Repeat radiographs of the hip showed no change in the position of  the fixation of the fracture.  She was kept on close observation after  that.  Her mental status improved, and she tolerated her physical  therapy much better after that.   She did become hypokalemia, received some potassium to correct that.  She was able to ambulate approximately 15 feet with a walker and standby  assist, moderate assist to return to bed.   It was recommended she go to rehab.  I agreed.   At the her discharge, her medications will be:  1. Lovenox 30 mg subcu q.24h. until December 9, then it can stop.  2. She takes Ativan 0.25 mg q.h.s., 0.5 mg daily.  3. Zyprexa 7.5 mg q.h.s.  4. She takes Cipro 500 mg twice a day until November 27 for  UTI.  5. Wellbutrin 300 mg daily.  6. She can have Darvocet-N 100 one every 4-6 hours p.r.n. for pain.   Physical therapist hip precautions.  Full range of motion to the hip.  No specific position precautions.  She is weightbearing as tolerated.  Isometric exercises thigh and hip muscles.  No abduction pillow needed.   Follow-up in a month, take staples out on the 27th.  Overall condition  is improved.   DISPOSITION:  Pending which nursing home she will be approved to go to.      Vickki Hearing, M.D.  Electronically Signed     SEH/MEDQ  D:  07/06/2006  T:  07/06/2006  Job:  [id number]

## 2011-01-01 NOTE — Op Note (Signed)
NAMEDOROTHIE, WAH                  ACCOUNT NO.:  [id number]   MEDICAL RECORD NO.:  [id number]          PATIENT TYPE:  INP   LOCATION:  A339                          FACILITY:  [facility]   PHYSICIAN:  Vickki Hearing, M.D.DATE OF BIRTH:  Nov 29, 1917   DATE OF PROCEDURE:  07/02/2006  DATE OF DISCHARGE:                                 OPERATIVE REPORT   PREOPERATIVE DIAGNOSIS:  Right femoral neck fracture (nondisplaced).   POSTOPERATIVE DIAGNOSIS:  Right femoral neck fracture (nondisplaced).   PROCEDURE:  Open treatment internal fixation.   SURGEON:  Vickki Hearing, M.D.   ANESTHETIC:  Spinal.   OPERATIVE FINDINGS:  Valgus impacted right femoral neck fracture.   BLOOD LOSS:  Minimal.   DRAINS:  None   HISTORY AND INDICATIONS:  An 77 year old female who fell, sustained a  fractured to her right femoral neck with valgus impaction.  A medical  consult was obtained after admission.  The patient was doing fine and on  July 01, 2006 her O2 saturation dropped to the high 70s.  Arterial blood  gas showed pO2 of 80.  Chest x-ray showed no acute disease.  A CT scan was  ordered to rule out pulmonary embolus,  embolus was ruled out.  The  patient's saturations recovered.  EKG and cardiac enzymes were normal.  No  further saturation drops noted.  The patient is noted to have chronic  pulmonary disease; has had previous workup with pulmonary function test,  with no obvious cause.  Does have chronic bronchitis.   The patient was then observed after the O2 saturation drop, and was cleared  again and stabilized with surgery on November 17 as stated.   DESCRIPTION OF PROCEDURE:  The procedure was done as follows.  The patient  taken down to the operating room preoperative area, where her right leg was  marked for surgery, countersigned by the surgeon.  History and physical  checked and updated.  In transport to the operating suite antibiotics were  started.  A spinal anesthetic was  administered by a nurse anesthetist, and  she was placed supine on the operating table.  The left leg was placed in a  well leg holder, with appropriate padding.  The right leg placed in traction  on the fracture table.  Peroneal post noted.   The right leg was positioned and radiographs confirmed a stable valgus-  impacted fracture.  AP and lateral views were obtained.   The leg was then prepped and draped using DuraPrep.  DuraPrep was allowed to  dry.  Time-out procedure was completed.  The surgery was confirmed as  stated.  Using a spinal needle, the position of the incision was marked and  incision was made distal to the greater trochanter.  Subcutaneous tissue  divided.  Electrocautery used until fascia was exposed.  The fascia was  split in line with the skin incision, retracted anteriorly and posteriorly.  Vastus lateralis muscle split, perforating vessels coagulated, subperiosteal  dissection exposed the femur.  A guide pin was placed in the inferior  portion of the neck and the  AP and centered on the lateral, and then to 2  other guide pins were placed using appropriate drill guides.  We measured,  drilled and applied 3 screws in succession.  Radiographs confirmed their  position.  The position of the fracture, which was stable.  The wound was  irrigated, closed in layered fashion, injected with 30 cc of Marcaine.  Sterile dressing applied.  The patient taken off the fracture table onto the  bed in stable condition.  Returned to the PACU the same.   POSTOPERATIVE PLAN:  Weight bearing as tolerated with a walker.  Lovenox  protocol for DVT prevention.  Appropriate pulmonary care.      Vickki Hearing, M.D.  Electronically Signed     SEH/MEDQ  D:  07/02/2006  T:  07/02/2006  Job:  [id number]

## 2011-01-13 NOTE — H&P (Signed)
Destiny [name], Destiny [name]                  ACCOUNT NO.:  [id number]  MEDICAL RECORD NO.:  [id number]           PATIENT TYPE:  I  LOCATION:  A215                          FACILITY:  [facility]  PHYSICIAN:  Markavious Micco D. Felecia Shelling, MD   DATE OF BIRTH:  November 02, 1917  DATE OF ADMISSION:  12/19/2010 DATE OF DISCHARGE:  LH                             HISTORY & PHYSICAL   CHIEF COMPLAINT:  Change in mental status and generalized weakness.  HISTORY OF PRESENT ILLNESS:  This is a 77 year old female, patient of Dr. Carylon Perches who is currently a resident of [facility], brought to emergency room with above complaints.  This patient fell in the nursing home last night and developed some swelling on her right side of the head.  The patient has been monitored and there was no bleeding or seizure activity.  However, the patient became weak and there was significant change in her mental status.  She was brought to emergency room and the patient was evaluated.  Her CT scan of the head showed only right scalp hematoma.  There was no any sign of intracranial hemorrhage or fracture.  The patient's lab, however, showed worsening of her renal function with increase in her creatinine.  Her urinalysis was not significant for urinary tract infection.  The patient was admitted and started on IV hydration and planned to be monitored.  REVIEW OF SYSTEMS:  The patient is somewhat weak and confused and disoriented, unable to give any history.  PAST MEDICAL HISTORY: 1. History of community-acquired pneumonia. 2. Type 2 diabetes mellitus. 3. Depression with psychosis. 4. Vitamin B12 deficiency. 5. History of spinal stenosis. 6. Osteoporosis. 7. Hypokalemia. 8. Dehydration. 9. E. coli UTI.  CURRENT MEDICATIONS: 1. Zyban 320 mg p.o. daily. 2. Zoloft 150 mg daily. 3. Lorazepam 0.5 mg t.i.d. 4. Zyprexa 2.5 mg at bedtime. 5. Oxygen 3 liters via nasal cannula.  SOCIAL HISTORY:  The patient is a widow.  She is currently  a resident of [facility].  She has a history of tobacco smoking in the past. No history of alcohol or substance abuse.  FAMILY HISTORY:  There is no known history of cancer, diabetes, or liver disease.  PHYSICAL EXAMINATION:  GENERAL:  The patient is awake, but lethargic and chronically sick looking. VITAL SIGNS:  Blood pressure 144/79, pulse 94, respiratory rate 20, and temperature 98 degrees Fahrenheit. HEENT:  Pupils are equal and reactive. NECK:  Supple. CHEST:  Poor air entry.  Bilateral rhonchi. CARDIOVASCULAR:  First and second heart sounds heard.  No murmur.  No gallop. ABDOMEN:  Soft and lax.  Bowel sounds positive.  No mass or organomegaly. EXTREMITIES:  No leg edema.  LABORATORY DATA ON ADMISSION:  Urinalysis:  wbc 3-6, rbc 3-6, bacteria many, specific gravity 1.020, pH 6.0, leukocytes negative, nitrite negative.  Carbon dioxide 24, calcium ionized 1.1.  Hemoglobin 10.2, hematocrit 32.  Sodium 141, potassium 4.1, chloride 107, glucose 88, BUN 21, and creatinine 1.4.  ASSESSMENT:  This is a 77 year old female patient who is currently a resident of [facility], came with a change in mental  status and lethargic.  The patient has a history of fall, however, CT scan of the head is negative.  Her creatinine has been elevated significantly from her baseline.  No significant other lab findings.  The etiology of her change in mental status is not clear at this time.  PLAN:  We will continue the patient on neuro check.  We will gradually rehydrate the patient.  We will monitor her CBC and BMP.  We will continue her medications except the Zyprexa.  We will hold Zyprexa at this time and we will consider later to restart may be in lower dose.     Kammy Klett D. Felecia Shelling, MD     TDF/MEDQ  D:  12/19/2010  T:  12/20/2010  Job:  [id number]  Electronically Signed by Avon Gully MD on 01/13/2011 08:24:24 AM

## 2011-05-18 LAB — CBC
RBC: 4.03
WBC: 6.6

## 2011-05-18 LAB — CARDIAC PANEL(CRET KIN+CKTOT+MB+TROPI)
CK, MB: 1.6
Relative Index: INVALID
Troponin I: <0.01

## 2011-05-18 LAB — DIFFERENTIAL
Lymphocytes Relative: 28
Lymphs Abs: 1.9
Monocytes Relative: 9
Neutro Abs: 3.7
Neutrophils Relative %: 56

## 2011-05-18 LAB — POCT CARDIAC MARKERS
CKMB, poc: 1.2
Myoglobin, poc: 135

## 2011-05-18 LAB — BASIC METABOLIC PANEL
BUN: 19
CO2: 24
Chloride: 111
Creatinine, Ser: 1.04

## 2011-07-17 ENCOUNTER — Other Ambulatory Visit (HOSPITAL_COMMUNITY): Payer: Self-pay

## 2011-08-17 DEATH — deceased
